# Patient Record
Sex: Female | Born: 1960 | Race: White | Hispanic: No | Marital: Married | Smoking: Former smoker
Health system: Southern US, Community
[De-identification: ages and names within clinical notes are randomized; demographics above are authoritative.]

## PROBLEM LIST (undated history)

## (undated) DIAGNOSIS — C801 Malignant (primary) neoplasm, unspecified: Secondary | ICD-10-CM

## (undated) DIAGNOSIS — R002 Palpitations: Secondary | ICD-10-CM

## (undated) DIAGNOSIS — M1990 Unspecified osteoarthritis, unspecified site: Secondary | ICD-10-CM

## (undated) DIAGNOSIS — I10 Essential (primary) hypertension: Secondary | ICD-10-CM

## (undated) DIAGNOSIS — D649 Anemia, unspecified: Secondary | ICD-10-CM

## (undated) DIAGNOSIS — E559 Vitamin D deficiency, unspecified: Secondary | ICD-10-CM

## (undated) DIAGNOSIS — E669 Obesity, unspecified: Secondary | ICD-10-CM

## (undated) DIAGNOSIS — E785 Hyperlipidemia, unspecified: Secondary | ICD-10-CM

## (undated) DIAGNOSIS — J302 Other seasonal allergic rhinitis: Secondary | ICD-10-CM

## (undated) HISTORY — DX: Vitamin D deficiency, unspecified: E55.9

## (undated) HISTORY — DX: Obesity, unspecified: E66.9

## (undated) HISTORY — PX: OTHER SURGICAL HISTORY: SHX169

## (undated) HISTORY — DX: Hyperlipidemia, unspecified: E78.5

## (undated) HISTORY — DX: Essential (primary) hypertension: I10

## (undated) HISTORY — PX: COLONOSCOPY: SHX174

## (undated) HISTORY — PX: CYST REMOVAL NECK: SHX6281

## (undated) HISTORY — DX: Anemia, unspecified: D64.9

---

## 1989-05-14 HISTORY — PX: TUBAL LIGATION: SHX77

## 2005-03-10 ENCOUNTER — Emergency Department: Payer: Self-pay | Admitting: Emergency Medicine

## 2006-08-17 ENCOUNTER — Other Ambulatory Visit: Payer: Self-pay

## 2006-08-17 ENCOUNTER — Emergency Department: Payer: Self-pay | Admitting: Emergency Medicine

## 2006-09-26 ENCOUNTER — Ambulatory Visit: Payer: Self-pay

## 2006-10-03 DIAGNOSIS — L720 Epidermal cyst: Secondary | ICD-10-CM | POA: Insufficient documentation

## 2006-10-08 ENCOUNTER — Ambulatory Visit: Payer: Self-pay

## 2006-10-09 DIAGNOSIS — D485 Neoplasm of uncertain behavior of skin: Secondary | ICD-10-CM | POA: Insufficient documentation

## 2008-06-15 DIAGNOSIS — F39 Unspecified mood [affective] disorder: Secondary | ICD-10-CM | POA: Insufficient documentation

## 2008-06-15 DIAGNOSIS — E7800 Pure hypercholesterolemia, unspecified: Secondary | ICD-10-CM | POA: Insufficient documentation

## 2008-06-23 ENCOUNTER — Ambulatory Visit: Payer: Self-pay | Admitting: Family Medicine

## 2008-06-23 LAB — HM MAMMOGRAPHY

## 2008-08-03 ENCOUNTER — Ambulatory Visit: Payer: Self-pay | Admitting: Gastroenterology

## 2008-08-10 ENCOUNTER — Ambulatory Visit: Payer: Self-pay | Admitting: Unknown Physician Specialty

## 2008-08-17 ENCOUNTER — Ambulatory Visit: Payer: Self-pay | Admitting: Unknown Physician Specialty

## 2008-10-05 DIAGNOSIS — D649 Anemia, unspecified: Secondary | ICD-10-CM | POA: Insufficient documentation

## 2010-04-21 ENCOUNTER — Ambulatory Visit: Payer: Self-pay

## 2012-05-14 DIAGNOSIS — C801 Malignant (primary) neoplasm, unspecified: Secondary | ICD-10-CM

## 2012-05-14 HISTORY — DX: Malignant (primary) neoplasm, unspecified: C80.1

## 2012-07-11 ENCOUNTER — Ambulatory Visit: Payer: Self-pay | Admitting: Family Medicine

## 2013-04-17 LAB — CBC AND DIFFERENTIAL
HCT: 37 % (ref 36–46)
Hemoglobin: 12.7 g/dL (ref 12.0–16.0)
Platelets: 244 10*3/uL (ref 150–399)
WBC: 6.1 10^3/mL

## 2013-04-17 LAB — TSH: TSH: 1.65 u[IU]/mL (ref 0.41–5.90)

## 2013-11-27 LAB — BASIC METABOLIC PANEL
BUN: 20 mg/dL (ref 4–21)
Creatinine: 0.8 mg/dL (ref 0.5–1.1)
Glucose: 86 mg/dL
Potassium: 4.2 mmol/L (ref 3.4–5.3)
Sodium: 145 mmol/L (ref 137–147)

## 2013-11-27 LAB — LIPID PANEL
Cholesterol: 204 mg/dL — AB (ref 0–200)
HDL: 57 mg/dL (ref 35–70)
LDL Cholesterol: 131 mg/dL
Triglycerides: 82 mg/dL (ref 40–160)

## 2013-11-27 LAB — HEPATIC FUNCTION PANEL
ALT: 20 U/L (ref 7–35)
AST: 24 U/L (ref 13–35)

## 2014-10-19 ENCOUNTER — Other Ambulatory Visit: Payer: Self-pay

## 2014-10-19 DIAGNOSIS — I10 Essential (primary) hypertension: Secondary | ICD-10-CM | POA: Insufficient documentation

## 2014-10-19 MED ORDER — HYDROCHLOROTHIAZIDE 25 MG PO TABS: 25.0000 mg | ORAL_TABLET | Freq: Every day | ORAL | Status: DC

## 2015-01-21 ENCOUNTER — Other Ambulatory Visit: Payer: Self-pay | Admitting: Family Medicine

## 2015-01-21 NOTE — Telephone Encounter (Signed)
This was a pt of Debbie.  Last ov was on 11/27/2013. Last time the Inderal was refilled was on 06/29/2014 Last time the HCTZ was refilled was on 02/24/2014 Last time the Zolof was refilled was on 06/29/2014  Thanks,

## 2015-03-03 ENCOUNTER — Other Ambulatory Visit: Payer: Self-pay | Admitting: Family Medicine

## 2015-03-03 DIAGNOSIS — I10 Essential (primary) hypertension: Secondary | ICD-10-CM

## 2015-03-03 DIAGNOSIS — Z800 Family history of malignant neoplasm of digestive organs: Secondary | ICD-10-CM | POA: Insufficient documentation

## 2015-03-03 DIAGNOSIS — F430 Acute stress reaction: Secondary | ICD-10-CM

## 2015-03-08 ENCOUNTER — Other Ambulatory Visit: Payer: Self-pay | Admitting: Family Medicine

## 2015-03-08 DIAGNOSIS — Z1231 Encounter for screening mammogram for malignant neoplasm of breast: Secondary | ICD-10-CM

## 2015-03-11 ENCOUNTER — Ambulatory Visit
Admission: RE | Admit: 2015-03-11 | Discharge: 2015-03-11 | Disposition: A | Discharge status: Home / Self Care | Payer: BLUE CROSS/BLUE SHIELD | Source: Ambulatory Visit | Attending: Family Medicine | Admitting: Family Medicine

## 2015-03-11 ENCOUNTER — Encounter: Payer: Self-pay | Admitting: Physician Assistant

## 2015-03-11 ENCOUNTER — Other Ambulatory Visit: Payer: Self-pay | Admitting: Physician Assistant

## 2015-03-11 ENCOUNTER — Ambulatory Visit (INDEPENDENT_AMBULATORY_CARE_PROVIDER_SITE_OTHER): Payer: BLUE CROSS/BLUE SHIELD | Admitting: Physician Assistant

## 2015-03-11 VITALS — BP 118/80 | HR 71 | Temp 98.1°F | Resp 16 | Ht 65.0 in | Wt 219.6 lb

## 2015-03-11 DIAGNOSIS — Z800 Family history of malignant neoplasm of digestive organs: Secondary | ICD-10-CM | POA: Diagnosis not present

## 2015-03-11 DIAGNOSIS — E669 Obesity, unspecified: Secondary | ICD-10-CM | POA: Insufficient documentation

## 2015-03-11 DIAGNOSIS — N959 Unspecified menopausal and perimenopausal disorder: Secondary | ICD-10-CM | POA: Insufficient documentation

## 2015-03-11 DIAGNOSIS — E7800 Pure hypercholesterolemia, unspecified: Secondary | ICD-10-CM

## 2015-03-11 DIAGNOSIS — Z1211 Encounter for screening for malignant neoplasm of colon: Secondary | ICD-10-CM

## 2015-03-11 DIAGNOSIS — Z124 Encounter for screening for malignant neoplasm of cervix: Secondary | ICD-10-CM | POA: Diagnosis not present

## 2015-03-11 DIAGNOSIS — M2560 Stiffness of unspecified joint, not elsewhere classified: Secondary | ICD-10-CM | POA: Insufficient documentation

## 2015-03-11 DIAGNOSIS — Z0000 Encounter for general adult medical examination without abnormal findings: Secondary | ICD-10-CM | POA: Diagnosis not present

## 2015-03-11 DIAGNOSIS — E559 Vitamin D deficiency, unspecified: Secondary | ICD-10-CM | POA: Insufficient documentation

## 2015-03-11 DIAGNOSIS — Z23 Encounter for immunization: Secondary | ICD-10-CM

## 2015-03-11 DIAGNOSIS — C4491 Basal cell carcinoma of skin, unspecified: Secondary | ICD-10-CM | POA: Insufficient documentation

## 2015-03-11 DIAGNOSIS — Z1231 Encounter for screening mammogram for malignant neoplasm of breast: Secondary | ICD-10-CM | POA: Insufficient documentation

## 2015-03-11 HISTORY — DX: Malignant (primary) neoplasm, unspecified: C80.1

## 2015-03-11 NOTE — Patient Instructions (Signed)
Health Maintenance, Female Adopting a healthy lifestyle and getting preventive care can go a long way to promote health and wellness. Talk with your health care provider about what schedule of regular examinations is right for you. This is a good chance for you to check in with your provider about disease prevention and staying healthy. In between checkups, there are plenty of things you can do on your own. Experts have done a lot of research about which lifestyle changes and preventive measures are most likely to keep you healthy. Ask your health care provider for more information. WEIGHT AND DIET  Eat a healthy diet  Be sure to include plenty of vegetables, fruits, low-fat dairy products, and lean protein.  Do not eat a lot of foods high in solid fats, added sugars, or salt.  Get regular exercise. This is one of the most important things you can do for your health.  Most adults should exercise for at least 150 minutes each week. The exercise should increase your heart rate and make you sweat (moderate-intensity exercise).  Most adults should also do strengthening exercises at least twice a week. This is in addition to the moderate-intensity exercise.  Maintain a healthy weight  Body mass index (BMI) is a measurement that can be used to identify possible weight problems. It estimates body fat based on height and weight. Your health care provider can help determine your BMI and help you achieve or maintain a healthy weight.  For females 20 years of age and older:   A BMI below 18.5 is considered underweight.  A BMI of 18.5 to 24.9 is normal.  A BMI of 25 to 29.9 is considered overweight.  A BMI of 30 and above is considered obese.  Watch levels of cholesterol and blood lipids  You should start having your blood tested for lipids and cholesterol at 53 years of age, then have this test every 5 years.  You may need to have your cholesterol levels checked more often if:  Your lipid  or cholesterol levels are high.  You are older than 53 years of age.  You are at high risk for heart disease.  CANCER SCREENING   Lung Cancer  Lung cancer screening is recommended for adults 55-80 years old who are at high risk for lung cancer because of a history of smoking.  A yearly low-dose CT scan of the lungs is recommended for people who:  Currently smoke.  Have quit within the past 15 years.  Have at least a 30-pack-year history of smoking. A pack year is smoking an average of one pack of cigarettes a day for 1 year.  Yearly screening should continue until it has been 15 years since you quit.  Yearly screening should stop if you develop a health problem that would prevent you from having lung cancer treatment.  Breast Cancer  Practice breast self-awareness. This means understanding how your breasts normally appear and feel.  It also means doing regular breast self-exams. Let your health care provider know about any changes, no matter how small.  If you are in your 20s or 30s, you should have a clinical breast exam (CBE) by a health care provider every 1-3 years as part of a regular health exam.  If you are 40 or older, have a CBE every year. Also consider having a breast X-ray (mammogram) every year.  If you have a family history of breast cancer, talk to your health care provider about genetic screening.  If you   are at high risk for breast cancer, talk to your health care provider about having an MRI and a mammogram every year.  Breast cancer gene (BRCA) assessment is recommended for women who have family members with BRCA-related cancers. BRCA-related cancers include:  Breast.  Ovarian.  Tubal.  Peritoneal cancers.  Results of the assessment will determine the need for genetic counseling and BRCA1 and BRCA2 testing. Cervical Cancer Your health care provider may recommend that you be screened regularly for cancer of the pelvic organs (ovaries, uterus, and  vagina). This screening involves a pelvic examination, including checking for microscopic changes to the surface of your cervix (Pap test). You may be encouraged to have this screening done every 3 years, beginning at age 21.  For women ages 30-65, health care providers may recommend pelvic exams and Pap testing every 3 years, or they may recommend the Pap and pelvic exam, combined with testing for human papilloma virus (HPV), every 5 years. Some types of HPV increase your risk of cervical cancer. Testing for HPV may also be done on women of any age with unclear Pap test results.  Other health care providers may not recommend any screening for nonpregnant women who are considered low risk for pelvic cancer and who do not have symptoms. Ask your health care provider if a screening pelvic exam is right for you.  If you have had past treatment for cervical cancer or a condition that could lead to cancer, you need Pap tests and screening for cancer for at least 20 years after your treatment. If Pap tests have been discontinued, your risk factors (such as having a new sexual partner) need to be reassessed to determine if screening should resume. Some women have medical problems that increase the chance of getting cervical cancer. In these cases, your health care provider may recommend more frequent screening and Pap tests. Colorectal Cancer  This type of cancer can be detected and often prevented.  Routine colorectal cancer screening usually begins at 53 years of age and continues through 53 years of age.  Your health care provider may recommend screening at an earlier age if you have risk factors for colon cancer.  Your health care provider may also recommend using home test kits to check for hidden blood in the stool.  A small camera at the end of a tube can be used to examine your colon directly (sigmoidoscopy or colonoscopy). This is done to check for the earliest forms of colorectal  cancer.  Routine screening usually begins at age 50.  Direct examination of the colon should be repeated every 5-10 years through 53 years of age. However, you may need to be screened more often if early forms of precancerous polyps or small growths are found. Skin Cancer  Check your skin from head to toe regularly.  Tell your health care provider about any new moles or changes in moles, especially if there is a change in a mole's shape or color.  Also tell your health care provider if you have a mole that is larger than the size of a pencil eraser.  Always use sunscreen. Apply sunscreen liberally and repeatedly throughout the day.  Protect yourself by wearing long sleeves, pants, a wide-brimmed hat, and sunglasses whenever you are outside. HEART DISEASE, DIABETES, AND HIGH BLOOD PRESSURE   High blood pressure causes heart disease and increases the risk of stroke. High blood pressure is more likely to develop in:  People who have blood pressure in the high end   of the normal range (130-139/85-89 mm Hg).  People who are overweight or obese.  People who are African American.  If you are 38-23 years of age, have your blood pressure checked every 3-5 years. If you are 61 years of age or older, have your blood pressure checked every year. You should have your blood pressure measured twice--once when you are at a hospital or clinic, and once when you are not at a hospital or clinic. Record the average of the two measurements. To check your blood pressure when you are not at a hospital or clinic, you can use:  An automated blood pressure machine at a pharmacy.  A home blood pressure monitor.  If you are between 45 years and 39 years old, ask your health care provider if you should take aspirin to prevent strokes.  Have regular diabetes screenings. This involves taking a blood sample to check your fasting blood sugar level.  If you are at a normal weight and have a low risk for diabetes,  have this test once every three years after 53 years of age.  If you are overweight and have a high risk for diabetes, consider being tested at a younger age or more often. PREVENTING INFECTION  Hepatitis B  If you have a higher risk for hepatitis B, you should be screened for this virus. You are considered at high risk for hepatitis B if:  You were born in a country where hepatitis B is common. Ask your health care provider which countries are considered high risk.  Your parents were born in a high-risk country, and you have not been immunized against hepatitis B (hepatitis B vaccine).  You have HIV or AIDS.  You use needles to inject street drugs.  You live with someone who has hepatitis B.  You have had sex with someone who has hepatitis B.  You get hemodialysis treatment.  You take certain medicines for conditions, including cancer, organ transplantation, and autoimmune conditions. Hepatitis C  Blood testing is recommended for:  Everyone born from [birth year] through 1964.  Anyone with known risk factors for hepatitis C. Sexually transmitted infections (STIs)  You should be screened for sexually transmitted infections (STIs) including gonorrhea and chlamydia if:  You are sexually active and are younger than 53 years of age.  You are older than 53 years of age and your health care provider tells you that you are at risk for this type of infection.  Your sexual activity has changed since you were last screened and you are at an increased risk for chlamydia or gonorrhea. Ask your health care provider if you are at risk.  If you do not have HIV, but are at risk, it may be recommended that you take a prescription medicine daily to prevent HIV infection. This is called pre-exposure prophylaxis (PrEP). You are considered at risk if:  You are sexually active and do not regularly use condoms or know the HIV status of your partner(s).  You take drugs by injection.  You are sexually  active with a partner who has HIV. Talk with your health care provider about whether you are at high risk of being infected with HIV. If you choose to begin PrEP, you should first be tested for HIV. You should then be tested every 3 months for as long as you are taking PrEP.  PREGNANCY   If you are premenopausal and you may become pregnant, ask your health care provider about preconception counseling.  If you may  become pregnant, take 400 to 800 micrograms (mcg) of folic acid every day.  If you want to prevent pregnancy, talk to your health care provider about birth control (contraception). OSTEOPOROSIS AND MENOPAUSE   Osteoporosis is a disease in which the bones lose minerals and strength with aging. This can result in serious bone fractures. Your risk for osteoporosis can be identified using a bone density scan.  If you are 61 years of age or older, or if you are at risk for osteoporosis and fractures, ask your health care provider if you should be screened.  Ask your health care provider whether you should take a calcium or vitamin D supplement to lower your risk for osteoporosis.  Menopause may have certain physical symptoms and risks.  Hormone replacement therapy may reduce some of these symptoms and risks. Talk to your health care provider about whether hormone replacement therapy is right for you.  HOME CARE INSTRUCTIONS   Schedule regular health, dental, and eye exams.  Stay current with your immunizations.   Do not use any tobacco products including cigarettes, chewing tobacco, or electronic cigarettes.  If you are pregnant, do not drink alcohol.  If you are breastfeeding, limit how much and how often you drink alcohol.  Limit alcohol intake to no more than 1 drink per day for nonpregnant women. One drink equals 12 ounces of beer, 5 ounces of wine, or 1 ounces of hard liquor.  Do not use street drugs.  Do not share needles.  Ask your health care provider for help if  you need support or information about quitting drugs.  Tell your health care provider if you often feel depressed.  Tell your health care provider if you have ever been abused or do not feel safe at home.   This information is not intended to replace advice given to you by your health care provider. Make sure you discuss any questions you have with your health care provider.   Document Released: 11/13/2010 Document Revised: 05/21/2014 Document Reviewed: 04/01/2013 Elsevier Interactive Patient Education [employer].

## 2015-03-11 NOTE — Progress Notes (Signed)
Patient: Nicole Copeland, Female    DOB: 07/08/1960, 54 y.o.   MRN: [id number] Visit Date: 03/11/2015  Today's Provider: Mar Daring, PA-C   Chief Complaint  Patient presents with  . Annual Exam   Subjective:    Annual physical exam Nicole Copeland is a 54 y.o. female who presents today for health maintenance and complete physical. She feels well. She reports exercising sometimes walks for 20-30 minutes. She reports she is sleeping well.Per patient just had a mammogram done today. She states it has been a while since her last Pap smear. She states her last Pap smear was normal however. There is no family history of cervical or ovarian cancer. She states she has had a colonoscopy previously but knows that she is due for one. Her mother did pass away from colon cancer.  She is also interested in trying to discontinue her Zoloft. She feels that the Zoloft is why she is put on some weight. She has been trying to lose weight without success. She was put on Zoloft for situational depression following the death of her mother and her sister 2 weeks apart. She does feel like she is doing better at this time.  Last PCP: 04/17/13 here.  Review of Systems  Constitutional: Negative.   HENT: Negative.   Eyes: Negative.   Respiratory: Negative.   Cardiovascular: Negative.   Gastrointestinal: Negative.   Endocrine: Negative.   Genitourinary: Negative.   Musculoskeletal: Positive for joint swelling and arthralgias.  Skin: Negative.   Allergic/Immunologic: Negative.   Neurological: Negative.   Hematological: Negative.   Psychiatric/Behavioral: Negative.     Social History She  reports that she has quit smoking. She does not have any smokeless tobacco history on file. She reports that she drinks alcohol. She reports that she does not use illicit drugs. Social History   Social History  . Marital Status: Married    Spouse Name: N/A  . Number of Children: N/A  . Years of  Education: N/A   Social History Main Topics  . Smoking status: Former Research scientist (life sciences)  . Smokeless tobacco: None     Comment: smoked for 20-25 years, previously smoked 1 PPD. Quit in 2007  . Alcohol Use: Yes     Comment: Occasional  . Drug Use: No  . Sexual Activity: Not Asked   Other Topics Concern  . None   Social History Narrative    Patient Active Problem List   Diagnosis Date Noted  . Avitaminosis D 03/11/2015  . Adiposity 03/11/2015  . Menopausal and postmenopausal disorder 03/11/2015  . Joint stiffness 03/11/2015  . Basal cell carcinoma 03/11/2015  . Family history of colon cancer 03/03/2015  . Acute stress disorder 03/03/2015  . Hypertension 10/19/2014  . 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 Pure hypercholesterolemia 06/15/2008  . Episodic mood disorder ([place]) 06/15/2008  . Neoplasm of uncertain behavior of skin 10/09/2006  . Atheroma, skin 10/03/2006    Past Surgical History  Procedure Laterality Date  . Tubal ligation  1991  . Status post endometrial ablation  2009 or 2010    not sure    Family History  Family Status  Relation Status Death Age  . Mother Deceased 23    Colon Cancer, chronic lung disease  . Father Deceased   . Maternal Grandmother Deceased     died from a stroke  . Maternal Grandfather Deceased     died from cancer. Unknown Type  . Paternal Grandmother Deceased  died in a house fire  . Paternal Grandfather Deceased     died from congestive heart failure   Her family history includes CAD in her father and mother; Congestive Heart Failure in her father and mother; Diabetes in her father and paternal grandfather; Hypertension in her maternal grandmother and mother.    Allergies  Allergen Reactions  . Penicillins     Previous Medications   ALPRAZOLAM (XANAX) 0.5 MG TABLET    Take by mouth.   ASCORBIC ACID (VITAMIN C) 500 MG TABLET    Take by mouth.   ASPIRIN 81 MG TABLET    Take by mouth.   CALCIUM CITRATE-VITAMIN D (CITRACAL+D) 315-200  MG-UNIT TABLET    Take by mouth.   HYDROCHLOROTHIAZIDE (HYDRODIURIL) 25 MG TABLET    TAKE 1 TABLET (25 MG TOTAL) BY MOUTH DAILY.   IBUPROFEN (MOTRIN IB) 200 MG TABLET    Take by mouth.   INDERAL XL 80 MG 24 HR CAPSULE    TAKE ONE CAPSULE BY MOUTH EVERY DAY   MULTIPLE VITAMINS-MINERALS (WOMENS 50+ MULTI VITAMIN/MIN) TABS    Take by mouth.   SERTRALINE (ZOLOFT) 50 MG TABLET    TAKE 1 (ONE) TABLET BY MOUTH DAILY    Patient Care Team: Mar Daring, PA-C as PCP - General (Family Medicine)     Objective:   Vitals: BP 118/80 mmHg  Pulse 71  Temp(Src) 98.1 F (36.7 C) (Oral)  Resp 16  Ht 5\' 5"  (1.651 m)  Wt 219 lb 9.6 oz (99.61 kg)  BMI 36.54 kg/m2   Physical Exam  Constitutional: She is oriented to person, place, and time. She appears well-developed and well-nourished. No distress.  HENT:  Head: Normocephalic and atraumatic.  Right Ear: Hearing, tympanic membrane, external ear and ear canal normal.  Left Ear: Hearing, tympanic membrane, external ear and ear canal normal.  Nose: Nose normal.  Mouth/Throat: Uvula is midline, oropharynx is clear and moist and mucous membranes are normal. No oropharyngeal exudate.  Eyes: Conjunctivae and EOM are normal. Pupils are equal, round, and reactive to light. Right eye exhibits no discharge. Left eye exhibits no discharge. No scleral icterus.  Neck: Normal range of motion. Neck supple. No JVD present. Carotid bruit is not present. No tracheal deviation present. No thyromegaly present.  Cardiovascular: Normal rate, regular rhythm, normal heart sounds and intact distal pulses.  Exam reveals no gallop and no friction rub.   No murmur heard. Pulmonary/Chest: Effort normal and breath sounds normal. No respiratory distress. She has no wheezes. She has no rales. She exhibits no tenderness. Right breast exhibits no inverted nipple, no mass, no nipple discharge, no skin change and no tenderness. Left breast exhibits no inverted nipple, no mass, no  nipple discharge, no skin change and no tenderness. Breasts are symmetrical.  Abdominal: Soft. Bowel sounds are normal. She exhibits no distension and no mass. There is no tenderness. There is no rebound and no guarding. Hernia confirmed negative in the right inguinal area and confirmed negative in the left inguinal area.  Genitourinary: Rectum normal, vagina normal and uterus normal. No breast swelling, tenderness, discharge or bleeding. Pelvic exam was performed with patient supine. There is no rash, tenderness, lesion or injury on the right labia. There is no rash, tenderness, lesion or injury on the left labia. Cervix exhibits discharge (white and clear mucous discharge). Cervix exhibits no motion tenderness and no friability. Right adnexum displays no mass, no tenderness and no fullness. Left adnexum displays no mass, no tenderness  and no fullness. No erythema, tenderness or bleeding in the vagina. No signs of injury around the vagina. No vaginal discharge found.  Musculoskeletal: Normal range of motion. She exhibits no edema or tenderness.  Lymphadenopathy:    She has no cervical adenopathy.       Right: No inguinal adenopathy present.       Left: No inguinal adenopathy present.  Neurological: She is alert and oriented to person, place, and time. She has normal reflexes. No cranial nerve deficit. Coordination normal.  Skin: Skin is warm and dry. No rash noted. She is not diaphoretic.  Psychiatric: She has a normal mood and affect. Her behavior is normal. Judgment and thought content normal.  Vitals reviewed.    Depression Screen No flowsheet data found.    Assessment & Plan:     Routine Health Maintenance and Physical Exam  1. Annual physical exam Physical exam today was normal. We did discuss discontinuation of her Zoloft. I will have her cut the 50 mg Zoloft in half and take 25 mg 1 week, then she is to take half a pill (25 mg) every other day 1 week, then she may discontinue if  she is doing well. She is to call the office if she is unable to discontinue or if she has any adverse reactions while discontinuing the medication. If she does well I will see her back in one year for her repeat physical exam. She may call the office for any questions or concerns she has in the meantime. - CBC with Differential - Hemoglobin A1c - Lipid panel - TSH - Comprehensive Metabolic Panel (CMET)  2. Need for influenza vaccination Flu vaccine was given today without complication. - Flu Vaccine QUAD 36+ mos IM  3. Pure hypercholesterolemia History of this. I will check her lipids and follow-up pending results. She is not currently on any medication for this. If labs are stable I will follow-up with her in one year for repeat physical exam. - Lipid panel  4. Family history of colon cancer Positive family history of colon cancer in her mother. This was cause of death.  5. Cervical cancer screening Pap collected today. We'll follow-up pending results. - Pap IG w/ reflex to HPV when [facility] ([facility])  6. Colon cancer screening Referral for colonoscopy. She states her last colonoscopy was done by Dr. Rock Nephew. - Ambulatory referral to Gastroenterology  Exercise Activities and Dietary recommendations Goals    None      Immunization History  Administered Date(s) Administered  . Tdap 03/23/2011    Health Maintenance  Topic Date Due  . Hepatitis C Screening  [birth year]  . HIV Screening  08/11/1976  . TETANUS/TDAP  08/11/1980  . PAP SMEAR  08/12/1982  . MAMMOGRAM  08/12/2011  . COLONOSCOPY  08/12/2011  . INFLUENZA VACCINE  12/13/2014      Discussed health benefits of physical activity, and encouraged her to engage in regular exercise appropriate for her age and condition.    --------------------------------------------------------------------

## 2015-03-12 LAB — CBC WITH DIFFERENTIAL/PLATELET
Basophils Absolute: 0 10*3/uL (ref 0.0–0.2)
Basos: 0 %
EOS (ABSOLUTE): 0.3 10*3/uL (ref 0.0–0.4)
Eos: 3 %
Hematocrit: 39.4 % (ref 34.0–46.6)
Hemoglobin: 13.1 g/dL (ref 11.1–15.9)
Immature Grans (Abs): 0 10*3/uL (ref 0.0–0.1)
Immature Granulocytes: 0 %
Lymphocytes Absolute: 2.7 10*3/uL (ref 0.7–3.1)
Lymphs: 33 %
MCH: 27.5 pg (ref 26.6–33.0)
MCHC: 33.2 g/dL (ref 31.5–35.7)
MCV: 83 fL (ref 79–97)
Monocytes Absolute: 0.7 10*3/uL (ref 0.1–0.9)
Monocytes: 9 %
Neutrophils Absolute: 4.3 10*3/uL (ref 1.4–7.0)
Neutrophils: 55 %
Platelets: 298 10*3/uL (ref 150–379)
RBC: 4.77 x10E6/uL (ref 3.77–5.28)
RDW: 14.5 % (ref 12.3–15.4)
WBC: 8 10*3/uL (ref 3.4–10.8)

## 2015-03-12 LAB — HEMOGLOBIN A1C
Est. average glucose Bld gHb Est-mCnc: 114 mg/dL
Hgb A1c MFr Bld: 5.6 % (ref 4.8–5.6)

## 2015-03-12 LAB — COMPREHENSIVE METABOLIC PANEL
ALT: 17 IU/L (ref 0–32)
AST: 22 IU/L (ref 0–40)
Albumin/Globulin Ratio: 1.8 (ref 1.1–2.5)
Albumin: 4.5 g/dL (ref 3.5–5.5)
Alkaline Phosphatase: 86 IU/L (ref 39–117)
BUN/Creatinine Ratio: 18 (ref 9–23)
BUN: 14 mg/dL (ref 6–24)
Bilirubin Total: 0.4 mg/dL (ref 0.0–1.2)
CO2: 26 mmol/L (ref 18–29)
Calcium: 9.7 mg/dL (ref 8.7–10.2)
Chloride: 99 mmol/L (ref 97–106)
Creatinine, Ser: 0.79 mg/dL (ref 0.57–1.00)
GFR calc Af Amer: 99 mL/min/{1.73_m2} (ref >59)
GFR calc non Af Amer: 86 mL/min/{1.73_m2} (ref >59)
Globulin, Total: 2.5 g/dL (ref 1.5–4.5)
Glucose: 72 mg/dL (ref 65–99)
Potassium: 4.2 mmol/L (ref 3.5–5.2)
Sodium: 140 mmol/L (ref 136–144)
Total Protein: 7 g/dL (ref 6.0–8.5)

## 2015-03-12 LAB — LIPID PANEL
Chol/HDL Ratio: 4 ratio units (ref 0.0–4.4)
Cholesterol, Total: 198 mg/dL (ref 100–199)
HDL: 49 mg/dL (ref >39)
LDL Calculated: 111 mg/dL — ABNORMAL HIGH (ref 0–99)
Triglycerides: 188 mg/dL — ABNORMAL HIGH (ref 0–149)
VLDL Cholesterol Cal: 38 mg/dL (ref 5–40)

## 2015-03-12 LAB — TSH: TSH: 2.4 u[IU]/mL (ref 0.450–4.500)

## 2015-03-16 LAB — PAP IG W/ RFLX HPV ASCU: PAP Smear Comment: 0

## 2015-03-17 ENCOUNTER — Telehealth: Payer: Self-pay | Admitting: Physician Assistant

## 2015-03-17 NOTE — Telephone Encounter (Signed)
Called patient I received the form and informed her that Tawanna Sat will be in office next week but I will make sure form gets fill out and to be fax. Just wanted patient to know I had the form.  Thanks,  -Tabithia Stroder

## 2015-03-17 NOTE — Telephone Encounter (Signed)
Pt stated she faxed a form that she needs Nicole Copeland to complete and fax back to her employer. Pt stated that she was told by someone in our office it was received. I advised pt that Nicole Copeland is out of the office this week. Pt request that it be done next week b/c it is due 03/29/15. Pt request a call when it is faxed. Thanks TNP

## 2015-03-18 ENCOUNTER — Telehealth: Payer: Self-pay

## 2015-03-18 NOTE — Telephone Encounter (Signed)
-----   Message from Mar Daring, [place] sent at 03/17/2015  3:22 PM EDT ----- Normal pap.

## 2015-03-18 NOTE — Telephone Encounter (Signed)
LMTCB  Thanks,  -Sydnee Lamour 

## 2015-03-18 NOTE — Telephone Encounter (Signed)
-----   Message from Mar Daring, [place] sent at 03/17/2015  3:12 PM EDT ----- All labs are stable and WNL with exception of triglycerides and LDL which were slightly elevated.  Limit cholesterol and high fat foods in diet. May start fish oil 1200 mg twice daily to help cholesterol control.  Will recheck in one year.

## 2015-03-18 NOTE — Telephone Encounter (Signed)
LM

## 2015-03-18 NOTE — Telephone Encounter (Signed)
Advised patient of lab results  

## 2015-03-21 NOTE — Telephone Encounter (Signed)
Left message letting her know form was faxed.  Thanks,  -Ashmi Blas

## 2015-04-10 ENCOUNTER — Other Ambulatory Visit: Payer: Self-pay | Admitting: Family Medicine

## 2015-04-10 DIAGNOSIS — I10 Essential (primary) hypertension: Secondary | ICD-10-CM

## 2015-04-10 DIAGNOSIS — F39 Unspecified mood [affective] disorder: Secondary | ICD-10-CM

## 2015-12-05 ENCOUNTER — Other Ambulatory Visit: Payer: Self-pay | Admitting: Physician Assistant

## 2015-12-05 DIAGNOSIS — I10 Essential (primary) hypertension: Secondary | ICD-10-CM

## 2015-12-05 DIAGNOSIS — F39 Unspecified mood [affective] disorder: Secondary | ICD-10-CM

## 2016-02-14 ENCOUNTER — Other Ambulatory Visit: Payer: Self-pay | Admitting: Physician Assistant

## 2016-02-14 DIAGNOSIS — Z1231 Encounter for screening mammogram for malignant neoplasm of breast: Secondary | ICD-10-CM

## 2016-02-27 ENCOUNTER — Telehealth: Payer: Self-pay | Admitting: Physician Assistant

## 2016-02-27 NOTE — Telephone Encounter (Signed)
Pt wants to know if we have her Blood type in her records.  Her call back is [phone]  Thanks Con Memos

## 2016-02-28 NOTE — Telephone Encounter (Signed)
Patient advised that we do not have her blood type in our records.

## 2016-03-16 ENCOUNTER — Ambulatory Visit: Payer: BLUE CROSS/BLUE SHIELD

## 2016-03-16 ENCOUNTER — Encounter: Payer: BLUE CROSS/BLUE SHIELD | Admitting: Physician Assistant

## 2016-03-20 ENCOUNTER — Encounter: Payer: Self-pay | Admitting: Physician Assistant

## 2016-03-20 ENCOUNTER — Ambulatory Visit (INDEPENDENT_AMBULATORY_CARE_PROVIDER_SITE_OTHER): Payer: BLUE CROSS/BLUE SHIELD | Admitting: Physician Assistant

## 2016-03-20 VITALS — BP 106/70 | HR 68 | Temp 98.0°F | Resp 16 | Ht 64.5 in | Wt 211.0 lb

## 2016-03-20 DIAGNOSIS — Z0000 Encounter for general adult medical examination without abnormal findings: Secondary | ICD-10-CM

## 2016-03-20 DIAGNOSIS — Z1211 Encounter for screening for malignant neoplasm of colon: Secondary | ICD-10-CM | POA: Diagnosis not present

## 2016-03-20 DIAGNOSIS — E559 Vitamin D deficiency, unspecified: Secondary | ICD-10-CM | POA: Diagnosis not present

## 2016-03-20 DIAGNOSIS — E7800 Pure hypercholesterolemia, unspecified: Secondary | ICD-10-CM | POA: Diagnosis not present

## 2016-03-20 DIAGNOSIS — Z833 Family history of diabetes mellitus: Secondary | ICD-10-CM | POA: Diagnosis not present

## 2016-03-20 DIAGNOSIS — Z800 Family history of malignant neoplasm of digestive organs: Secondary | ICD-10-CM | POA: Diagnosis not present

## 2016-03-20 DIAGNOSIS — I10 Essential (primary) hypertension: Secondary | ICD-10-CM | POA: Diagnosis not present

## 2016-03-20 NOTE — Patient Instructions (Signed)
Health Maintenance, Female Adopting a healthy lifestyle and getting preventive care can go a long way to promote health and wellness. Talk with your health care provider about what schedule of regular examinations is right for you. This is a good chance for you to check in with your provider about disease prevention and staying healthy. In between checkups, there are plenty of things you can do on your own. Experts have done a lot of research about which lifestyle changes and preventive measures are most likely to keep you healthy. Ask your health care provider for more information. WEIGHT AND DIET  Eat a healthy diet  Be sure to include plenty of vegetables, fruits, low-fat dairy products, and lean protein.  Do not eat a lot of foods high in solid fats, added sugars, or salt.  Get regular exercise. This is one of the most important things you can do for your health.  Most adults should exercise for at least 150 minutes each week. The exercise should increase your heart rate and make you sweat (moderate-intensity exercise).  Most adults should also do strengthening exercises at least twice a week. This is in addition to the moderate-intensity exercise.  Maintain a healthy weight  Body mass index (BMI) is a measurement that can be used to identify possible weight problems. It estimates body fat based on height and weight. Your health care provider can help determine your BMI and help you achieve or maintain a healthy weight.  For females 28 years of age and older:   A BMI below 18.5 is considered underweight.  A BMI of 18.5 to 24.9 is normal.  A BMI of 25 to 29.9 is considered overweight.  A BMI of 30 and above is considered obese.  Watch levels of cholesterol and blood lipids  You should start having your blood tested for lipids and cholesterol at 54 years of age, then have this test every 5 years.  You may need to have your cholesterol levels checked more often if:  Your lipid  or cholesterol levels are high.  You are older than 54 years of age.  You are at high risk for heart disease.  CANCER SCREENING   Lung Cancer  Lung cancer screening is recommended for adults 54-66 years old who are at high risk for lung cancer because of a history of smoking.  A yearly low-dose CT scan of the lungs is recommended for people who:  Currently smoke.  Have quit within the past 15 years.  Have at least a 30-pack-year history of smoking. A pack year is smoking an average of one pack of cigarettes a day for 1 year.  Yearly screening should continue until it has been 15 years since you quit.  Yearly screening should stop if you develop a health problem that would prevent you from having lung cancer treatment.  Breast Cancer  Practice breast self-awareness. This means understanding how your breasts normally appear and feel.  It also means doing regular breast self-exams. Let your health care provider know about any changes, no matter how small.  If you are in your 20s or 30s, you should have a clinical breast exam (CBE) by a health care provider every 1-3 years as part of a regular health exam.  If you are 25 or older, have a CBE every year. Also consider having a breast X-ray (mammogram) every year.  If you have a family history of breast cancer, talk to your health care provider about genetic screening.  If you  are at high risk for breast cancer, talk to your health care provider about having an MRI and a mammogram every year.  Breast cancer gene (BRCA) assessment is recommended for women who have family members with BRCA-related cancers. BRCA-related cancers include:  Breast.  Ovarian.  Tubal.  Peritoneal cancers.  Results of the assessment will determine the need for genetic counseling and BRCA1 and BRCA2 testing. Cervical Cancer Your health care provider may recommend that you be screened regularly for cancer of the pelvic organs (ovaries, uterus, and  vagina). This screening involves a pelvic examination, including checking for microscopic changes to the surface of your cervix (Pap test). You may be encouraged to have this screening done every 3 years, beginning at age 21.  For women ages 30-65, health care providers may recommend pelvic exams and Pap testing every 3 years, or they may recommend the Pap and pelvic exam, combined with testing for human papilloma virus (HPV), every 5 years. Some types of HPV increase your risk of cervical cancer. Testing for HPV may also be done on women of any age with unclear Pap test results.  Other health care providers may not recommend any screening for nonpregnant women who are considered low risk for pelvic cancer and who do not have symptoms. Ask your health care provider if a screening pelvic exam is right for you.  If you have had past treatment for cervical cancer or a condition that could lead to cancer, you need Pap tests and screening for cancer for at least 20 years after your treatment. If Pap tests have been discontinued, your risk factors (such as having a new sexual partner) need to be reassessed to determine if screening should resume. Some women have medical problems that increase the chance of getting cervical cancer. In these cases, your health care provider may recommend more frequent screening and Pap tests. Colorectal Cancer  This type of cancer can be detected and often prevented.  Routine colorectal cancer screening usually begins at 54 years of age and continues through 54 years of age.  Your health care provider may recommend screening at an earlier age if you have risk factors for colon cancer.  Your health care provider may also recommend using home test kits to check for hidden blood in the stool.  A small camera at the end of a tube can be used to examine your colon directly (sigmoidoscopy or colonoscopy). This is done to check for the earliest forms of colorectal  cancer.  Routine screening usually begins at age 50.  Direct examination of the colon should be repeated every 5-10 years through 54 years of age. However, you may need to be screened more often if early forms of precancerous polyps or small growths are found. Skin Cancer  Check your skin from head to toe regularly.  Tell your health care provider about any new moles or changes in moles, especially if there is a change in a mole's shape or color.  Also tell your health care provider if you have a mole that is larger than the size of a pencil eraser.  Always use sunscreen. Apply sunscreen liberally and repeatedly throughout the day.  Protect yourself by wearing long sleeves, pants, a wide-brimmed hat, and sunglasses whenever you are outside. HEART DISEASE, DIABETES, AND HIGH BLOOD PRESSURE   High blood pressure causes heart disease and increases the risk of stroke. High blood pressure is more likely to develop in:  People who have blood pressure in the high end   of the normal range (130-139/85-89 mm Hg).  People who are overweight or obese.  People who are African American.  If you are 38-23 years of age, have your blood pressure checked every 3-5 years. If you are 61 years of age or older, have your blood pressure checked every year. You should have your blood pressure measured twice--once when you are at a hospital or clinic, and once when you are not at a hospital or clinic. Record the average of the two measurements. To check your blood pressure when you are not at a hospital or clinic, you can use:  An automated blood pressure machine at a pharmacy.  A home blood pressure monitor.  If you are between 45 years and 39 years old, ask your health care provider if you should take aspirin to prevent strokes.  Have regular diabetes screenings. This involves taking a blood sample to check your fasting blood sugar level.  If you are at a normal weight and have a low risk for diabetes,  have this test once every three years after 54 years of age.  If you are overweight and have a high risk for diabetes, consider being tested at a younger age or more often. PREVENTING INFECTION  Hepatitis B  If you have a higher risk for hepatitis B, you should be screened for this virus. You are considered at high risk for hepatitis B if:  You were born in a country where hepatitis B is common. Ask your health care provider which countries are considered high risk.  Your parents were born in a high-risk country, and you have not been immunized against hepatitis B (hepatitis B vaccine).  You have HIV or AIDS.  You use needles to inject street drugs.  You live with someone who has hepatitis B.  You have had sex with someone who has hepatitis B.  You get hemodialysis treatment.  You take certain medicines for conditions, including cancer, organ transplantation, and autoimmune conditions. Hepatitis C  Blood testing is recommended for:  Everyone born from [birth year] through 1964.  Anyone with known risk factors for hepatitis C. Sexually transmitted infections (STIs)  You should be screened for sexually transmitted infections (STIs) including gonorrhea and chlamydia if:  You are sexually active and are younger than 54 years of age.  You are older than 53 years of age and your health care provider tells you that you are at risk for this type of infection.  Your sexual activity has changed since you were last screened and you are at an increased risk for chlamydia or gonorrhea. Ask your health care provider if you are at risk.  If you do not have HIV, but are at risk, it may be recommended that you take a prescription medicine daily to prevent HIV infection. This is called pre-exposure prophylaxis (PrEP). You are considered at risk if:  You are sexually active and do not regularly use condoms or know the HIV status of your partner(s).  You take drugs by injection.  You are sexually  active with a partner who has HIV. Talk with your health care provider about whether you are at high risk of being infected with HIV. If you choose to begin PrEP, you should first be tested for HIV. You should then be tested every 3 months for as long as you are taking PrEP.  PREGNANCY   If you are premenopausal and you may become pregnant, ask your health care provider about preconception counseling.  If you may  become pregnant, take 400 to 800 micrograms (mcg) of folic acid every day.  If you want to prevent pregnancy, talk to your health care provider about birth control (contraception). OSTEOPOROSIS AND MENOPAUSE   Osteoporosis is a disease in which the bones lose minerals and strength with aging. This can result in serious bone fractures. Your risk for osteoporosis can be identified using a bone density scan.  If you are 61 years of age or older, or if you are at risk for osteoporosis and fractures, ask your health care provider if you should be screened.  Ask your health care provider whether you should take a calcium or vitamin D supplement to lower your risk for osteoporosis.  Menopause may have certain physical symptoms and risks.  Hormone replacement therapy may reduce some of these symptoms and risks. Talk to your health care provider about whether hormone replacement therapy is right for you.  HOME CARE INSTRUCTIONS   Schedule regular health, dental, and eye exams.  Stay current with your immunizations.   Do not use any tobacco products including cigarettes, chewing tobacco, or electronic cigarettes.  If you are pregnant, do not drink alcohol.  If you are breastfeeding, limit how much and how often you drink alcohol.  Limit alcohol intake to no more than 1 drink per day for nonpregnant women. One drink equals 12 ounces of beer, 5 ounces of wine, or 1 ounces of hard liquor.  Do not use street drugs.  Do not share needles.  Ask your health care provider for help if  you need support or information about quitting drugs.  Tell your health care provider if you often feel depressed.  Tell your health care provider if you have ever been abused or do not feel safe at home.   This information is not intended to replace advice given to you by your health care provider. Make sure you discuss any questions you have with your health care provider.   Document Released: 11/13/2010 Document Revised: 05/21/2014 Document Reviewed: 04/01/2013 Elsevier Interactive Patient Education [employer].

## 2016-03-20 NOTE — Progress Notes (Signed)
Patient: Nicole Copeland, Female    DOB: 11-Sep-1960, 55 y.o.   MRN: [id number] Visit Date: 03/20/2016  Today's Provider: Mar Daring, PA-C   Chief Complaint  Patient presents with  . Annual Exam   Subjective:    Annual physical exam Nicole Copeland is a 55 y.o. female who presents today for health maintenance and complete physical. She feels well. She reports exercising active with daily activites. She reports she is sleeping well.  03/11/15 CPE 03/11/15 Pap-neg 03/11/15 Mammogram-BI-RADS 1; Negative FH 08/03/08 Colonoscopy-polyps; Was to repeat in 3 years; +FH in mother (cause of death at 38 yo) Flu vaccine given at work; Patient cannot remember exact date -----------------------------------------------------------------   Review of Systems  Constitutional: Negative.   HENT: Negative.   Eyes: Negative.   Respiratory: Negative.   Cardiovascular: Negative.   Gastrointestinal: Negative.   Endocrine: Negative.   Genitourinary: Negative.   Musculoskeletal: Positive for back pain and joint swelling.  Skin: Negative.   Allergic/Immunologic: Negative.   Neurological: Negative.   Hematological: Negative.   Psychiatric/Behavioral: Negative.     Social History      She  reports that she has been smoking Cigarettes.  She has never used smokeless tobacco. She reports that she drinks alcohol. She reports that she does not use drugs.       Social History   Social History  . Marital status: Married    Spouse name: N/A  . Number of children: 4  . Years of education: N/A   Social History Main Topics  . Smoking status: Current Some Day Smoker    Types: Cigarettes  . Smokeless tobacco: Never Used     Comment: smoked for 20-25 years, previously smoked 1 PPD. Quit in 2007  . Alcohol use Yes     Comment: Occasional  . Drug use: No  . Sexual activity: Not Asked   Other Topics Concern  . None   Social History Narrative  . None    Past Medical History:    Diagnosis Date  . Anemia   . Cancer ([place]) 2014   basal cell skin cancer  . Hyperlipidemia   . Hypertension   . Obesity   . Vitamin D deficiency      Patient Active Problem List   Diagnosis Date Noted  . Avitaminosis D 03/11/2015  . Adiposity 03/11/2015  . Menopausal and postmenopausal disorder 03/11/2015  . Joint stiffness 03/11/2015  . Basal cell carcinoma 03/11/2015  . Family history of colon cancer 03/03/2015  . Acute stress disorder 03/03/2015  . Hypertension 10/19/2014  . 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 Pure hypercholesterolemia 06/15/2008  . Episodic mood disorder ([place]) 06/15/2008  . Neoplasm of uncertain behavior of skin 10/09/2006  . Atheroma, skin 10/03/2006    Past Surgical History:  Procedure Laterality Date  . Status post endometrial ablation  2009 or 2010   not sure  . TUBAL LIGATION  1991    Family History        Family Status  Relation Status  . Mother Deceased at age 14   Colon Cancer, chronic lung disease  . Father Deceased  . Maternal Grandmother Deceased   died from a stroke  . Maternal Grandfather Deceased   died from cancer. Unknown Type  . Paternal Grandmother Deceased   died in a house fire  . Paternal Grandfather Deceased   died from congestive heart failure  . Sister Deceased at age 40   kidney   .  Daughter Alive  . Son Alive  . Sister Alive  . Sister Alive  . Daughter Alive  . Daughter Alive        Her family history includes CAD in her father and mother; Cancer in her mother; Celiac disease in her sister; Congestive Heart Failure in her father and mother; Diabetes in her father, paternal grandfather, and sister; Hypertension in her maternal grandmother and mother; Kidney disease in her sister; Osteoarthritis in her sister.     Allergies  Allergen Reactions  . Penicillins      Current Outpatient Prescriptions:  .  ascorbic acid (VITAMIN C) 500 MG tablet, Take by mouth., Disp: , Rfl:  .  aspirin 81 MG tablet, Take by  mouth., Disp: , Rfl:  .  calcium citrate-vitamin D (CITRACAL+D) 315-200 MG-UNIT tablet, Take by mouth., Disp: , Rfl:  .  hydrochlorothiazide (HYDRODIURIL) 25 MG tablet, TAKE 1 TABLET (25 MG TOTAL) BY MOUTH DAILY., Disp: 30 tablet, Rfl: 5 .  ibuprofen (MOTRIN IB) 200 MG tablet, Take by mouth., Disp: , Rfl:  .  INDERAL XL 80 MG 24 hr capsule, TAKE ONE CAPSULE BY MOUTH EVERY DAY, Disp: 30 capsule, Rfl: 5 .  Multiple Vitamins-Minerals (WOMENS 50+ MULTI VITAMIN/MIN) TABS, Take by mouth., Disp: , Rfl:  .  sertraline (ZOLOFT) 50 MG tablet, TAKE 1 TABLET EVERY DAY, Disp: 30 tablet, Rfl: 5   Patient Care Team: Mar Daring, PA-C as PCP - General (Family Medicine)      Objective:   Vitals: BP 106/70 (BP Location: Right Arm, Patient Position: Sitting, Cuff Size: Large)   Pulse 68   Temp 98 F (36.7 C) (Oral)   Resp 16   Ht 5' 4.5" (1.638 m)   Wt 211 lb (95.7 kg)   BMI 35.66 kg/m    Physical Exam  Constitutional: She is oriented to person, place, and time. She appears well-developed and well-nourished. No distress.  HENT:  Head: Normocephalic and atraumatic.  Right Ear: Tympanic membrane, external ear and ear canal normal.  Left Ear: Tympanic membrane, external ear and ear canal normal.  Nose: Nose normal.  Mouth/Throat: Uvula is midline, oropharynx is clear and moist and mucous membranes are normal. No oropharyngeal exudate.  Eyes: Conjunctivae and EOM are normal. Pupils are equal, round, and reactive to light. Right eye exhibits no discharge. Left eye exhibits no discharge. No scleral icterus.  Neck: Normal range of motion. Neck supple. No JVD present. Carotid bruit is not present. No tracheal deviation present. No thyromegaly present.  Cardiovascular: Normal rate, regular rhythm, normal heart sounds and intact distal pulses.  Exam reveals no gallop and no friction rub.   No murmur heard. Pulmonary/Chest: Effort normal and breath sounds normal. No respiratory distress. She has no  wheezes. She has no rales. She exhibits no tenderness. Right breast exhibits no inverted nipple, no mass, no nipple discharge, no skin change and no tenderness. Left breast exhibits no inverted nipple, no mass, no nipple discharge, no skin change and no tenderness. Breasts are symmetrical.  Abdominal: Soft. Bowel sounds are normal. She exhibits no distension and no mass. There is no tenderness. There is no rebound and no guarding.  Musculoskeletal: Normal range of motion. She exhibits no edema or tenderness.  Lymphadenopathy:    She has no cervical adenopathy.  Neurological: She is alert and oriented to person, place, and time.  Skin: Skin is warm and dry. No rash noted. She is not diaphoretic.  Psychiatric: She has a normal mood and affect.  Her behavior is normal. Judgment and thought content normal.  Vitals reviewed.   Depression Screen PHQ 2/9 Scores 03/20/2016  PHQ - 2 Score 0      Assessment & Plan:     Routine Health Maintenance and Physical Exam  Exercise Activities and Dietary recommendations Goals    None      Immunization History  Administered Date(s) Administered  . Influenza,inj,Quad PF,36+ Mos 03/11/2015  . Tdap 03/23/2011    Health Maintenance  Topic Date Due  . COLONOSCOPY  08/12/2011  . INFLUENZA VACCINE  12/13/2015  . MAMMOGRAM  03/10/2017  . PAP SMEAR  03/09/2018  . TETANUS/TDAP  03/22/2021  . Hepatitis C Screening  Completed  . HIV Screening  Completed     Discussed health benefits of physical activity, and encouraged her to engage in regular exercise appropriate for her age and condition.    1. Annual physical exam Normal physical exam today. Will check labs as below and f/u pending lab results. If labs are stable and WNL she will not need to have these rechecked for one year at her next annual physical exam. She is to call the office in the meantime if she has any acute issue, questions or concerns. - TSH  2. Essential hypertension Stable.  Continue current medical treatment plan. Will check labs as below and f/u pending results. - CBC with Differential - Comprehensive metabolic panel  3. Avitaminosis D Stable.   4. Pure hypercholesterolemia Will check labs as below and f/u pending results. - Comprehensive metabolic panel - Lipid panel  5. Colon cancer screening Family history in mother; diagnosed and cause of death at 33. Previously done at [place]. Will refer back to [facility] for colonoscopy. - Ambulatory referral to Gastroenterology  6. Family history of colon cancer See above medical treatment plan. - Ambulatory referral to Gastroenterology  7. Family history of diabetes mellitus Will check labs as below and f/u pending results. - Hemoglobin A1c  --------------------------------------------------------------------    Mar Daring, PA-C  [facility]

## 2016-03-21 ENCOUNTER — Telehealth: Payer: Self-pay

## 2016-03-21 LAB — CBC WITH DIFFERENTIAL/PLATELET
Basophils Absolute: 0 10*3/uL (ref 0.0–0.2)
Basos: 0 %
EOS (ABSOLUTE): 0.3 10*3/uL (ref 0.0–0.4)
Eos: 5 %
Hematocrit: 38.2 % (ref 34.0–46.6)
Hemoglobin: 12.8 g/dL (ref 11.1–15.9)
Immature Grans (Abs): 0 10*3/uL (ref 0.0–0.1)
Immature Granulocytes: 0 %
Lymphocytes Absolute: 2 10*3/uL (ref 0.7–3.1)
Lymphs: 34 %
MCH: 27.9 pg (ref 26.6–33.0)
MCHC: 33.5 g/dL (ref 31.5–35.7)
MCV: 83 fL (ref 79–97)
Monocytes Absolute: 0.4 10*3/uL (ref 0.1–0.9)
Monocytes: 7 %
Neutrophils Absolute: 3.2 10*3/uL (ref 1.4–7.0)
Neutrophils: 54 %
Platelets: 280 10*3/uL (ref 150–379)
RBC: 4.59 x10E6/uL (ref 3.77–5.28)
RDW: 14.7 % (ref 12.3–15.4)
WBC: 5.9 10*3/uL (ref 3.4–10.8)

## 2016-03-21 LAB — LIPID PANEL
Chol/HDL Ratio: 4.7 ratio units — ABNORMAL HIGH (ref 0.0–4.4)
Cholesterol, Total: 221 mg/dL — ABNORMAL HIGH (ref 100–199)
HDL: 47 mg/dL (ref >39)
LDL Calculated: 135 mg/dL — ABNORMAL HIGH (ref 0–99)
Triglycerides: 195 mg/dL — ABNORMAL HIGH (ref 0–149)
VLDL Cholesterol Cal: 39 mg/dL (ref 5–40)

## 2016-03-21 LAB — COMPREHENSIVE METABOLIC PANEL
ALT: 18 IU/L (ref 0–32)
AST: 21 IU/L (ref 0–40)
Albumin/Globulin Ratio: 1.5 (ref 1.2–2.2)
Albumin: 4.3 g/dL (ref 3.5–5.5)
Alkaline Phosphatase: 76 IU/L (ref 39–117)
BUN/Creatinine Ratio: 18 (ref 9–23)
BUN: 13 mg/dL (ref 6–24)
Bilirubin Total: 0.4 mg/dL (ref 0.0–1.2)
CO2: 24 mmol/L (ref 18–29)
Calcium: 9.2 mg/dL (ref 8.7–10.2)
Chloride: 100 mmol/L (ref 96–106)
Creatinine, Ser: 0.73 mg/dL (ref 0.57–1.00)
GFR calc Af Amer: 108 mL/min/{1.73_m2} (ref >59)
GFR calc non Af Amer: 94 mL/min/{1.73_m2} (ref >59)
Globulin, Total: 2.8 g/dL (ref 1.5–4.5)
Glucose: 74 mg/dL (ref 65–99)
Potassium: 4.4 mmol/L (ref 3.5–5.2)
Sodium: 141 mmol/L (ref 134–144)
Total Protein: 7.1 g/dL (ref 6.0–8.5)

## 2016-03-21 LAB — HEMOGLOBIN A1C
Est. average glucose Bld gHb Est-mCnc: 103 mg/dL
Hgb A1c MFr Bld: 5.2 % (ref 4.8–5.6)

## 2016-03-21 LAB — TSH: TSH: 2.28 u[IU]/mL (ref 0.450–4.500)

## 2016-03-21 NOTE — Telephone Encounter (Signed)
-----   Message from Mar Daring, [place] sent at 03/21/2016  8:08 AM EST ----- All labs are stable with exception of cholesterol that is up just slightly from last year. Make sure to avoid fatty foods and foods high in cholesterol. Try to increase physical activity to equal 150 min moderate activity per week.

## 2016-03-21 NOTE — Telephone Encounter (Signed)
Patient advised as below. Patient verbalizes understanding and is in agreement with treatment plan.  

## 2016-03-23 ENCOUNTER — Ambulatory Visit: Payer: BLUE CROSS/BLUE SHIELD

## 2016-03-23 ENCOUNTER — Telehealth: Payer: Self-pay

## 2016-03-23 ENCOUNTER — Other Ambulatory Visit: Payer: Self-pay

## 2016-03-23 NOTE — Telephone Encounter (Signed)
Gastroenterology Pre-Procedure Review  Request Date: 04/20/2016 Requesting Physician: Dr. Marlyn Corporal  PATIENT REVIEW QUESTIONS: The patient responded to the following health history questions as indicated:    1. Are you having any GI issues? no 2. Do you have a personal history of Polyps? yes (benign) 3. Do you have a family history of Colon Cancer or Polyps? yes (mother colon cancer) 4. Diabetes Mellitus? no 5. Joint replacements in the past 12 months?no 6. Major health problems in the past 3 months?no 7. Any artificial heart valves, MVP, or defibrillator?no    MEDICATIONS & ALLERGIES:    Patient reports the following regarding taking any anticoagulation/antiplatelet therapy:   Plavix, Coumadin, Eliquis, Xarelto, Lovenox, Pradaxa, Brilinta, or Effient? no Aspirin? yes (heart health)  Patient confirms/reports the following medications:  Current Outpatient Prescriptions  Medication Sig Dispense Refill  . ascorbic acid (VITAMIN C) 500 MG tablet Take by mouth.    Marland Kitchen aspirin 81 MG tablet Take by mouth.    . calcium citrate-vitamin D (CITRACAL+D) 315-200 MG-UNIT tablet Take by mouth.    . hydrochlorothiazide (HYDRODIURIL) 25 MG tablet TAKE 1 TABLET (25 MG TOTAL) BY MOUTH DAILY. 30 tablet 5  . ibuprofen (MOTRIN IB) 200 MG tablet Take by mouth.    . INDERAL XL 80 MG 24 hr capsule TAKE ONE CAPSULE BY MOUTH EVERY DAY 30 capsule 5  . Multiple Vitamins-Minerals (WOMENS 50+ MULTI VITAMIN/MIN) TABS Take by mouth.    . sertraline (ZOLOFT) 50 MG tablet TAKE 1 TABLET EVERY DAY 30 tablet 5   No current facility-administered medications for this visit.     Patient confirms/reports the following allergies:  Allergies  Allergen Reactions  . Penicillins     No orders of the defined types were placed in this encounter.   AUTHORIZATION INFORMATION Primary Insurance: 1D#: Group #:  Secondary Insurance: 1D#: Group #:  SCHEDULE INFORMATION: Date: 04/20/2016 Time: Location: [facility]

## 2016-03-23 NOTE — Telephone Encounter (Signed)
Screening Colonoscopy Z12.11 Family Hx of colon cancer Z80.0 [facility] 04/20/2016 Dr. Lannie Fields Pre cert is not required

## 2016-04-02 ENCOUNTER — Encounter: Payer: Self-pay | Admitting: Physician Assistant

## 2016-04-02 ENCOUNTER — Ambulatory Visit
Admission: RE | Admit: 2016-04-02 | Discharge: 2016-04-02 | Disposition: A | Discharge status: Home / Self Care | Payer: BLUE CROSS/BLUE SHIELD | Source: Ambulatory Visit | Attending: Physician Assistant | Admitting: Physician Assistant

## 2016-04-02 ENCOUNTER — Ambulatory Visit (INDEPENDENT_AMBULATORY_CARE_PROVIDER_SITE_OTHER): Payer: BLUE CROSS/BLUE SHIELD | Admitting: Physician Assistant

## 2016-04-02 ENCOUNTER — Telehealth: Payer: Self-pay

## 2016-04-02 VITALS — BP 112/70 | HR 75 | Temp 98.1°F | Resp 16 | Wt 210.8 lb

## 2016-04-02 DIAGNOSIS — J189 Pneumonia, unspecified organism: Secondary | ICD-10-CM

## 2016-04-02 DIAGNOSIS — R0789 Other chest pain: Secondary | ICD-10-CM | POA: Diagnosis not present

## 2016-04-02 DIAGNOSIS — R059 Cough, unspecified: Secondary | ICD-10-CM

## 2016-04-02 DIAGNOSIS — R05 Cough: Secondary | ICD-10-CM

## 2016-04-02 DIAGNOSIS — R079 Chest pain, unspecified: Secondary | ICD-10-CM | POA: Insufficient documentation

## 2016-04-02 DIAGNOSIS — J181 Lobar pneumonia, unspecified organism: Secondary | ICD-10-CM | POA: Diagnosis not present

## 2016-04-02 MED ORDER — LEVOFLOXACIN 750 MG PO TABS: 750.0000 mg | ORAL_TABLET | Freq: Every day | ORAL | 0 refills | Status: DC

## 2016-04-02 MED ORDER — HYDROCODONE-HOMATROPINE 5-1.5 MG/5ML PO SYRP: 5.0000 mL | ORAL_SOLUTION | Freq: Three times a day (TID) | ORAL | 0 refills | Status: DC | PRN

## 2016-04-02 NOTE — Patient Instructions (Signed)
Community-Acquired Pneumonia, Adult °Pneumonia is an infection of the lungs. There are different types of pneumonia. One type can develop while a person is in a hospital. A different type, called community-acquired pneumonia, develops in people who are not, or have not recently been, in the hospital or other health care facility. °What are the causes? °Pneumonia may be caused by bacteria, viruses, or funguses. Community-acquired pneumonia is often caused by Streptococcus pneumonia bacteria. These bacteria are often passed from one person to another by breathing in droplets from the cough or sneeze of an infected person. °What increases the risk? °The condition is more likely to develop in: °· People who have chronic diseases, such as chronic obstructive pulmonary disease (COPD), asthma, congestive heart failure, cystic fibrosis, diabetes, or kidney disease. °· People who have early-stage or late-stage HIV. °· People who have sickle cell disease. °· People who have had their spleen removed (splenectomy). °· People who have poor dental hygiene. °· People who have medical conditions that increase the risk of breathing in (aspirating) secretions their own mouth and nose. °· People who have a weakened immune system (immunocompromised). °· People who smoke. °· People who travel to areas where pneumonia-causing germs commonly exist. °· People who are around animal habitats or animals that have pneumonia-causing germs, including birds, bats, rabbits, cats, and farm animals. ° °What are the signs or symptoms? °Symptoms of this condition include: °· A dry cough. °· A wet (productive) cough. °· Fever. °· Sweating. °· Chest pain, especially when breathing deeply or coughing. °· Rapid breathing or difficulty breathing. °· Shortness of breath. °· Shaking chills. °· Fatigue. °· Muscle aches. ° °How is this diagnosed? °Your health care provider will take a medical history and perform a physical exam. You may also have other tests,  including: °· Imaging studies of your chest, including X-rays. °· Tests to check your blood oxygen level and other blood gases. °· Other tests on blood, mucus (sputum), fluid around your lungs (pleural fluid), and urine. ° °If your pneumonia is severe, other tests may be done to identify the specific cause of your illness. °How is this treated? °The type of treatment that you receive depends on many factors, such as the cause of your pneumonia, the medicines you take, and other medical conditions that you have. For most adults, treatment and recovery from pneumonia may occur at home. In some cases, treatment must happen in a hospital. Treatment may include: °· Antibiotic medicines, if the pneumonia was caused by bacteria. °· Antiviral medicines, if the pneumonia was caused by a virus. °· Medicines that are given by mouth or through an IV tube. °· Oxygen. °· Respiratory therapy. ° °Although rare, treating severe pneumonia may include: °· Mechanical ventilation. This is done if you are not breathing well on your own and you cannot maintain a safe blood oxygen level. °· Thoracentesis. This procedure removes fluid around one lung or both lungs to help you breathe better. ° °Follow these instructions at home: °· Take over-the-counter and prescription medicines only as told by your health care provider. °? Only take cough medicine if you are losing sleep. Understand that cough medicine can prevent your body’s natural ability to remove mucus from your lungs. °? If you were prescribed an antibiotic medicine, take it as told by your health care provider. Do not stop taking the antibiotic even if you start to feel better. °· Sleep in a semi-upright position at night. Try sleeping in a reclining chair, or place a few pillows under your head. °· Do not use tobacco products, including cigarettes, chewing   tobacco, and e-cigarettes. If you need help quitting, ask your health care provider. °· Drink enough water to keep your urine  clear or pale yellow. This will help to thin out mucus secretions in your lungs. °How is this prevented? °There are ways that you can decrease your risk of developing community-acquired pneumonia. Consider getting a pneumococcal vaccine if: °· You are older than 54 years of age. °· You are older than 54 years of age and are undergoing cancer treatment, have chronic lung disease, or have other medical conditions that affect your immune system. Ask your health care provider if this applies to you. ° °There are different types and schedules of pneumococcal vaccines. Ask your health care provider which vaccination option is best for you. °You may also prevent community-acquired pneumonia if you take these actions: °· Get an influenza vaccine every year. Ask your health care provider which type of influenza vaccine is best for you. °· Go to the dentist on a regular basis. °· Wash your hands often. Use hand sanitizer if soap and water are not available. ° °Contact a health care provider if: °· You have a fever. °· You are losing sleep because you cannot control your cough with cough medicine. °Get help right away if: °· You have worsening shortness of breath. °· You have increased chest pain. °· Your sickness becomes worse, especially if you are an older adult or have a weakened immune system. °· You cough up blood. °This information is not intended to replace advice given to you by your health care provider. Make sure you discuss any questions you have with your health care provider. °Document Released: 04/30/2005 Document Revised: 09/08/2015 Document Reviewed: 08/25/2014 °Elsevier Interactive Patient Education © 2017 [employer] ° °

## 2016-04-02 NOTE — Progress Notes (Addendum)
Patient: Nicole Copeland Female    DOB: [birth year]   55 y.o.   MRN: [id number] Visit Date: 04/02/2016  Today's Provider: Mar Daring, PA-C   Chief Complaint  Patient presents with  . URI   Subjective:    HPI Upper Respiratory Infection: Patient complains of symptoms of a URI. Symptoms include congestion, cough, plugged sensation in right ear she reports it feels better than it did Thursday. and sore throat. Onset of symptoms was a few days ago, last Thursday, gradually worsening since that time. Patient called the telecom through her insurance and spoke with a nurse and she was prescribed Prednisone, which she took last dose today, and Benzonatate. She reports still having prolonged cough that is worse at night, and chest tightness. She is also having left  posterior chest wall pain.    Allergies  Allergen Reactions  . Penicillins      Current Outpatient Prescriptions:  .  ascorbic acid (VITAMIN C) 500 MG tablet, Take by mouth., Disp: , Rfl:  .  aspirin 81 MG tablet, Take by mouth., Disp: , Rfl:  .  calcium citrate-vitamin D (CITRACAL+D) 315-200 MG-UNIT tablet, Take by mouth., Disp: , Rfl:  .  hydrochlorothiazide (HYDRODIURIL) 25 MG tablet, TAKE 1 TABLET (25 MG TOTAL) BY MOUTH DAILY., Disp: 30 tablet, Rfl: 5 .  ibuprofen (MOTRIN IB) 200 MG tablet, Take by mouth., Disp: , Rfl:  .  INDERAL XL 80 MG 24 hr capsule, TAKE ONE CAPSULE BY MOUTH EVERY DAY, Disp: 30 capsule, Rfl: 5 .  Multiple Vitamins-Minerals (WOMENS 50+ MULTI VITAMIN/MIN) TABS, Take by mouth., Disp: , Rfl:  .  sertraline (ZOLOFT) 50 MG tablet, TAKE 1 TABLET EVERY DAY, Disp: 30 tablet, Rfl: 5  Review of Systems  Constitutional: Positive for chills and fatigue. Negative for fever.  HENT: Positive for congestion, ear pain and sore throat. Negative for hearing loss, postnasal drip, rhinorrhea, sinus pain, sinus pressure, sneezing, tinnitus and trouble swallowing.   Eyes: Negative for visual disturbance.    Respiratory: Positive for cough and chest tightness. Negative for shortness of breath and wheezing.   Cardiovascular: Positive for chest pain (left posterior chest wall). Negative for palpitations and leg swelling.  Gastrointestinal: Negative for abdominal pain and nausea.  Neurological: Positive for headaches. Negative for dizziness.    Social History  Substance Use Topics  . Smoking status: Current Some Day Smoker    Types: Cigarettes  . Smokeless tobacco: Never Used     Comment: smoked for 20-25 years, previously smoked 1 PPD. Quit in 2007  . Alcohol use Yes     Comment: Occasional   Objective:   BP 112/70 (BP Location: Right Arm, Patient Position: Sitting, Cuff Size: Normal)   Pulse 75   Temp 98.1 F (36.7 C) (Oral)   Resp 16   Wt 210 lb 12.8 oz (95.6 kg)   SpO2 97%   BMI 35.62 kg/m   Physical Exam  Constitutional: She appears well-developed and well-nourished. No distress.  HENT:  Head: Normocephalic and atraumatic.  Right Ear: Hearing, external ear and ear canal normal. Tympanic membrane is not erythematous and not bulging. A middle ear effusion is present.  Left Ear: Hearing, tympanic membrane, external ear and ear canal normal.  Nose: No mucosal edema or rhinorrhea. Right sinus exhibits no maxillary sinus tenderness and no frontal sinus tenderness. Left sinus exhibits no maxillary sinus tenderness and no frontal sinus tenderness.  Mouth/Throat: Uvula is midline and mucous membranes are normal.  Posterior oropharyngeal erythema present. No oropharyngeal exudate or posterior oropharyngeal edema.  Eyes: Conjunctivae are normal. Pupils are equal, round, and reactive to light. Right eye exhibits no discharge. Left eye exhibits no discharge. No scleral icterus.  Neck: Normal range of motion. Neck supple. No tracheal deviation present. No thyromegaly present.  Cardiovascular: Normal rate, regular rhythm and normal heart sounds.  Exam reveals no gallop and no friction rub.   No  murmur heard. Pulmonary/Chest: Effort normal. No stridor. No respiratory distress. She has decreased breath sounds in the right lower field. She has no wheezes. She has no rhonchi. She has no rales. She exhibits tenderness (left posterior chest wall).  Lymphadenopathy:    She has no cervical adenopathy.  Skin: Skin is warm and dry. She is not diaphoretic.  Vitals reviewed.      Assessment & Plan:     1. Pneumonia of left lower lobe due to infectious organism [facility]) I do suspect that patient may have walking pneumonia due to leftt lower chest wall pain with decreased breath sounds in that area. There were no adventitious breath sounds heard on today's exam. I will give Levaquin as below. I've also ordered a chest x-ray and will follow up pending these results. - levofloxacin (LEVAQUIN) 750 MG tablet; Take 1 tablet (750 mg total) by mouth daily.  Dispense: 7 tablet; Refill: 0  2. Cough Worsening cough with decreased breath sounds in the left lower lung field. We'll get chest x-ray to evaluate for pneumonia. Have given Hycodan cough syrup as below for nighttime cough. Patient was advised of drowsiness precautions with this medication. - HYDROcodone-homatropine (HYCODAN) 5-1.5 MG/5ML syrup; Take 5 mLs by mouth every 8 (eight) hours as needed.  Dispense: 180 mL; Refill: 0 - DG Chest 2 View; Future  3. Left-sided chest wall pain See above medical treatment plan. - DG Chest 2 View; Future        Mar Daring, PA-C  [facility]

## 2016-04-02 NOTE — Telephone Encounter (Signed)
-----   Message from Mar Daring, [place] sent at 04/02/2016  1:47 PM EST ----- Normal CXR

## 2016-04-02 NOTE — Telephone Encounter (Signed)
Patient has been advised. KW 

## 2016-04-13 ENCOUNTER — Ambulatory Visit: Payer: BLUE CROSS/BLUE SHIELD | Admitting: Physician Assistant

## 2016-04-19 NOTE — Discharge Instructions (Signed)

## 2016-04-20 ENCOUNTER — Ambulatory Visit
Admission: RE | Admit: 2016-04-20 | Discharge: 2016-04-20 | Disposition: A | Discharge status: Home / Self Care | Payer: BLUE CROSS/BLUE SHIELD | Source: Ambulatory Visit | Attending: Gastroenterology | Admitting: Gastroenterology

## 2016-04-20 ENCOUNTER — Ambulatory Visit: Payer: BLUE CROSS/BLUE SHIELD | Admitting: Anesthesiology

## 2016-04-20 ENCOUNTER — Encounter
Admission: RE | Disposition: A | Discharge status: Home / Self Care | Payer: Self-pay | Source: Ambulatory Visit | Attending: Gastroenterology

## 2016-04-20 DIAGNOSIS — I10 Essential (primary) hypertension: Secondary | ICD-10-CM | POA: Diagnosis not present

## 2016-04-20 DIAGNOSIS — Z6834 Body mass index (BMI) 34.0-34.9, adult: Secondary | ICD-10-CM | POA: Diagnosis not present

## 2016-04-20 DIAGNOSIS — E559 Vitamin D deficiency, unspecified: Secondary | ICD-10-CM | POA: Diagnosis not present

## 2016-04-20 DIAGNOSIS — Z7982 Long term (current) use of aspirin: Secondary | ICD-10-CM | POA: Insufficient documentation

## 2016-04-20 DIAGNOSIS — D649 Anemia, unspecified: Secondary | ICD-10-CM | POA: Diagnosis not present

## 2016-04-20 DIAGNOSIS — Z860100 Personal history of colon polyps, unspecified: Secondary | ICD-10-CM

## 2016-04-20 DIAGNOSIS — Z79899 Other long term (current) drug therapy: Secondary | ICD-10-CM | POA: Diagnosis not present

## 2016-04-20 DIAGNOSIS — M1990 Unspecified osteoarthritis, unspecified site: Secondary | ICD-10-CM | POA: Diagnosis not present

## 2016-04-20 DIAGNOSIS — Z800 Family history of malignant neoplasm of digestive organs: Secondary | ICD-10-CM | POA: Insufficient documentation

## 2016-04-20 DIAGNOSIS — E669 Obesity, unspecified: Secondary | ICD-10-CM | POA: Diagnosis not present

## 2016-04-20 DIAGNOSIS — D123 Benign neoplasm of transverse colon: Secondary | ICD-10-CM

## 2016-04-20 DIAGNOSIS — F17210 Nicotine dependence, cigarettes, uncomplicated: Secondary | ICD-10-CM | POA: Diagnosis not present

## 2016-04-20 DIAGNOSIS — Z1211 Encounter for screening for malignant neoplasm of colon: Secondary | ICD-10-CM | POA: Insufficient documentation

## 2016-04-20 DIAGNOSIS — K641 Second degree hemorrhoids: Secondary | ICD-10-CM | POA: Diagnosis not present

## 2016-04-20 DIAGNOSIS — E785 Hyperlipidemia, unspecified: Secondary | ICD-10-CM | POA: Diagnosis not present

## 2016-04-20 DIAGNOSIS — Z85828 Personal history of other malignant neoplasm of skin: Secondary | ICD-10-CM | POA: Insufficient documentation

## 2016-04-20 HISTORY — DX: Palpitations: R00.2

## 2016-04-20 HISTORY — PX: POLYPECTOMY: SHX5525

## 2016-04-20 HISTORY — DX: Unspecified osteoarthritis, unspecified site: M19.90

## 2016-04-20 HISTORY — PX: COLONOSCOPY WITH PROPOFOL: SHX5780

## 2016-04-20 SURGERY — COLONOSCOPY WITH PROPOFOL
Anesthesia: Monitor Anesthesia Care | Wound class: Contaminated

## 2016-04-20 MED ORDER — LIDOCAINE HCL (CARDIAC) 20 MG/ML IV SOLN
INTRAVENOUS | Status: DC | PRN
  Administered 2016-04-20: 50 mg via INTRAVENOUS

## 2016-04-20 MED ORDER — LACTATED RINGERS IV SOLN
INTRAVENOUS | Status: DC | PRN
  Administered 2016-04-20: 09:00:00 via INTRAVENOUS

## 2016-04-20 MED ORDER — PROPOFOL 10 MG/ML IV BOLUS
INTRAVENOUS | Status: DC | PRN
  Administered 2016-04-20 (×5): 50 mg via INTRAVENOUS

## 2016-04-20 MED ORDER — LACTATED RINGERS IV SOLN: INTRAVENOUS | Status: DC

## 2016-04-20 MED ORDER — ACETAMINOPHEN 160 MG/5ML PO SOLN: 325.0000 mg | ORAL | Status: DC | PRN

## 2016-04-20 MED ORDER — STERILE WATER FOR IRRIGATION IR SOLN
Status: DC | PRN
  Administered 2016-04-20: 09:00:00

## 2016-04-20 MED ORDER — ACETAMINOPHEN 325 MG PO TABS: 325.0000 mg | ORAL_TABLET | ORAL | Status: DC | PRN

## 2016-04-20 SURGICAL SUPPLY — 23 items

## 2016-04-20 NOTE — Op Note (Signed)
[facility] Patient Name: Nicole Copeland Procedure Date: 04/20/2016 9:09 AM MRN: [id number] Account #: [id number] Date of Birth: 04/22/1961 Admit Type: Outpatient Age: 55 Room: [facility] OR ROOM 01 Gender: Female Note Status: Finalized Procedure:            Colonoscopy Indications:          High risk colon cancer surveillance: Personal history                        of colonic polyps, Family history of colon cancer in a                        first-degree relative Providers:            Lucilla Lame MD, MD Referring MD:         Mar Daring (Referring MD) Medicines:            Propofol per Anesthesia Complications:        No immediate complications. Procedure:            Pre-Anesthesia Assessment:                       - Prior to the procedure, a History and Physical was                        performed, and patient medications and allergies were                        reviewed. The patient's tolerance of previous                        anesthesia was also reviewed. The risks and benefits of                        the procedure and the sedation options and risks were                        discussed with the patient. All questions were                        answered, and informed consent was obtained. Prior                        Anticoagulants: The patient has taken no previous                        anticoagulant or antiplatelet agents. ASA Grade                        Assessment: II - A patient with mild systemic disease.                        After reviewing the risks and benefits, the patient was                        deemed in satisfactory condition to undergo the                        procedure.  After obtaining informed consent, the colonoscope was                        passed under direct vision. Throughout the procedure,                        the patient's blood pressure, pulse, and oxygen     saturations were monitored continuously. The was                        introduced through the anus and advanced to the the                        cecum, identified by appendiceal orifice and ileocecal                        valve. The colonoscopy was performed without                        difficulty. The patient tolerated the procedure well.                        The quality of the bowel preparation was excellent. Findings:      The perianal and digital rectal examinations were normal.      A 3 mm polyp was found in the transverse colon. The polyp was sessile.       The polyp was removed with a cold biopsy forceps. Resection and       retrieval were complete.      Non-bleeding internal hemorrhoids were found during retroflexion. The       hemorrhoids were Grade II (internal hemorrhoids that prolapse but reduce       spontaneously). Impression:           - One 3 mm polyp in the transverse colon, removed with                        a cold biopsy forceps. Resected and retrieved.                       - Non-bleeding internal hemorrhoids. Recommendation:       - Discharge patient to home.                       - Resume previous diet.                       - Continue present medications.                       - Repeat colonoscopy in 5 years for surveillance. Procedure Code(s):    --- Professional ---                       [phone], Colonoscopy, flexible; with biopsy, single or                        multiple Diagnosis Code(s):    --- Professional ---                       Z86.010, Personal history of colonic polyps  D12.3, Benign neoplasm of transverse colon (hepatic                        flexure or splenic flexure) CPT copyright 2016 American Medical Association. All rights reserved. The codes documented in this report are preliminary and upon coder review may  be revised to meet current compliance requirements. Lucilla Lame MD, MD 04/20/2016 9:36:53 AM This report  has been signed electronically. Number of Addenda: 0 Note Initiated On: 04/20/2016 9:09 AM Scope Withdrawal Time: 0 hours 6 minutes 33 seconds  Total Procedure Duration: 0 hours 14 minutes 20 seconds       [facility]

## 2016-04-20 NOTE — Anesthesia Procedure Notes (Signed)
Procedure Name: MAC Performed by: Tanae Petrosky Pre-anesthesia Checklist: Patient identified, Emergency Drugs available, Suction available, Timeout performed and Patient being monitored Patient Re-evaluated:Patient Re-evaluated prior to inductionOxygen Delivery Method: Nasal cannula Placement Confirmation: positive ETCO2     

## 2016-04-20 NOTE — H&P (Signed)
Lucilla Lame, MD [facility] [address] [place], [place] [address] Phone: [phone] Fax : [phone]  Primary Care Physician:  Mar Daring, PA-C Primary Gastroenterologist:  Dr. Allen Norris  Pre-Procedure History & Physical: HPI:  Nicole Copeland is a 55 y.o. female is here for an colonoscopy.   Past Medical History:  Diagnosis Date  . Anemia   . Arthritis   . Cancer ([place]) 2014   basal cell skin cancer  . Hyperlipidemia   . Hypertension   . Obesity   . Palpitations    patient states it was related to caffeine intake  . Vitamin D deficiency     Past Surgical History:  Procedure Laterality Date  . COLONOSCOPY    . Status post endometrial ablation  2009 or 2010   not sure  . TUBAL LIGATION  1991    Prior to Admission medications   Medication Sig Start Date End Date Taking? Authorizing Provider  ascorbic acid (VITAMIN C) 500 MG tablet Take by mouth.    Historical Provider, MD  aspirin 81 MG tablet Take by mouth.    Historical Provider, MD  calcium citrate-vitamin D (CITRACAL+D) 315-200 MG-UNIT tablet Take by mouth.    Historical Provider, MD  hydrochlorothiazide (HYDRODIURIL) 25 MG tablet TAKE 1 TABLET (25 MG TOTAL) BY MOUTH DAILY. 12/05/15   Mar Daring, PA-C  HYDROcodone-homatropine (HYCODAN) 5-1.5 MG/5ML syrup Take 5 mLs by mouth every 8 (eight) hours as needed. Patient not taking: Reported on 04/17/2016 04/02/16   Mar Daring, PA-C  ibuprofen (MOTRIN IB) 200 MG tablet Take by mouth.    Historical Provider, MD  INDERAL XL 80 MG 24 hr capsule TAKE ONE CAPSULE BY MOUTH EVERY DAY 12/05/15   Mar Daring, PA-C  levofloxacin (LEVAQUIN) 750 MG tablet Take 1 tablet (750 mg total) by mouth daily. Patient not taking: Reported on 04/17/2016 04/02/16   Mar Daring, PA-C  Multiple Vitamins-Minerals (WOMENS 50+ [place] VITAMIN/MIN) TABS Take by mouth.    Historical Provider, MD  sertraline (ZOLOFT) 50 MG tablet TAKE 1 TABLET EVERY DAY 12/05/15    Mar Daring, PA-C    Allergies as of 03/23/2016 - Review Complete 03/23/2016  Allergen Reaction Noted  . Penicillins  03/03/2015    Family History  Problem Relation Age of Onset  . CAD Mother   . Congestive Heart Failure Mother   . Hypertension Mother   . Cancer Mother     colon  . CAD Father   . Diabetes Father   . Congestive Heart Failure Father   . Hypertension Maternal Grandmother   . Diabetes Paternal Grandfather   . Diabetes Sister   . Kidney disease Sister   . Celiac disease Sister   . Osteoarthritis Sister     Social History   Social History  . Marital status: Married    Spouse name: N/A  . Number of children: 2  . Years of education: N/A   Occupational History  . Not on file.   Social History Main Topics  . Smoking status: Current Some Day Smoker    Types: Cigarettes  . Smokeless tobacco: Never Used     Comment: smoked for 20-25 years, previously smoked 1 PPD. Quit in 2007  . Alcohol use Yes     Comment: Occasional  . Drug use: No  . Sexual activity: Not on file   Other Topics Concern  . Not on file   Social History Narrative  . No narrative on file  Review of Systems: See HPI, otherwise negative ROS  Physical Exam: There were no vitals taken for this visit. General:   Alert,  pleasant and cooperative in NAD Head:  Normocephalic and atraumatic. Neck:  Supple; no masses or thyromegaly. Lungs:  Clear throughout to auscultation.    Heart:  Regular rate and rhythm. Abdomen:  Soft, nontender and nondistended. Normal bowel sounds, without guarding, and without rebound.   Neurologic:  Alert and  oriented x4;  grossly normal neurologically.  Impression/Plan: Nicole Copeland is here for an colonoscopy to be performed for family history of colon cancer and personal history of colon polyps  Risks, benefits, limitations, and alternatives regarding  colonoscopy have been reviewed with the patient.  Questions have been answered.  All parties  agreeable.   Lucilla Lame, MD  04/20/2016, 8:34 AM

## 2016-04-20 NOTE — Transfer of Care (Signed)
Immediate Anesthesia Transfer of Care Note  Patient: Nicole Copeland  Procedure(s) Performed: Procedure(s): COLONOSCOPY WITH PROPOFOL (N/A) POLYPECTOMY  Patient Location: PACU  Anesthesia Type: MAC  Level of Consciousness: awake, alert  and patient cooperative  Airway and Oxygen Therapy: Patient Spontanous Breathing and Patient connected to supplemental oxygen  Post-op Assessment: Post-op Vital signs reviewed, Patient's Cardiovascular Status Stable, Respiratory Function Stable, Patent Airway and No signs of Nausea or vomiting  Post-op Vital Signs: Reviewed and stable  Complications: No apparent anesthesia complications

## 2016-04-20 NOTE — Anesthesia Postprocedure Evaluation (Signed)
Anesthesia Post Note  Patient: Nicole Copeland  Procedure(s) Performed: Procedure(s) (LRB): COLONOSCOPY WITH PROPOFOL (N/A) POLYPECTOMY  Patient location during evaluation: PACU Anesthesia Type: MAC Level of consciousness: awake and alert and oriented Pain management: satisfactory to patient Vital Signs Assessment: post-procedure vital signs reviewed and stable Respiratory status: spontaneous breathing, nonlabored ventilation and respiratory function stable Cardiovascular status: blood pressure returned to baseline and stable Postop Assessment: Adequate PO intake and No signs of nausea or vomiting Anesthetic complications: no    [name]

## 2016-04-20 NOTE — Anesthesia Preprocedure Evaluation (Signed)
Anesthesia Evaluation  Patient identified by MRN, date of birth, ID band Patient awake    Reviewed: Allergy & Precautions, H&P , NPO status , Patient's Chart, lab work & pertinent test results  Airway Mallampati: II  TM Distance: >3 FB Neck ROM: full    Dental no notable dental hx.    Pulmonary Current Smoker,    Pulmonary exam normal        Cardiovascular hypertension, Normal cardiovascular exam     Neuro/Psych    GI/Hepatic   Endo/Other    Renal/GU      Musculoskeletal   Abdominal   Peds  Hematology   Anesthesia Other Findings   Reproductive/Obstetrics                             Anesthesia Physical Anesthesia Plan  ASA: II  Anesthesia Plan: MAC   Post-op Pain Management:    Induction:   Airway Management Planned:   Additional Equipment:   Intra-op Plan:   Post-operative Plan:   Informed Consent: I have reviewed the patients History and Physical, chart, labs and discussed the procedure including the risks, benefits and alternatives for the proposed anesthesia with the patient or authorized representative who has indicated his/her understanding and acceptance.     Plan Discussed with:   Anesthesia Plan Comments:         Anesthesia Quick Evaluation  

## 2016-04-23 ENCOUNTER — Encounter: Payer: Self-pay | Admitting: Gastroenterology

## 2016-04-24 ENCOUNTER — Encounter: Payer: Self-pay | Admitting: Gastroenterology

## 2016-04-24 ENCOUNTER — Telehealth: Payer: Self-pay | Admitting: Physician Assistant

## 2016-04-24 NOTE — Telephone Encounter (Signed)
If she is having fever she should be tested for flu to make sure she is treated appropriately.

## 2016-04-24 NOTE — Telephone Encounter (Signed)
Pt stated that she thinks she is getting sick again. Pt LOV 04/02/16. Pt stated last night she started running a fever of 101.2, cough, congestion, & sore throat. Pt was hoping to see if Tawanna Sat would call something in so she didn't have to come out in the cold weather. [facility]. Please advise. Thanks TNP

## 2016-04-24 NOTE — Telephone Encounter (Signed)
lmtcb to schedule appointment. Renaldo Fiddler, CMA

## 2016-04-26 ENCOUNTER — Encounter: Payer: Self-pay | Admitting: Gastroenterology

## 2016-05-01 ENCOUNTER — Ambulatory Visit
Admission: RE | Admit: 2016-05-01 | Discharge: 2016-05-01 | Disposition: A | Discharge status: Home / Self Care | Payer: BLUE CROSS/BLUE SHIELD | Source: Ambulatory Visit | Attending: Physician Assistant | Admitting: Physician Assistant

## 2016-05-01 DIAGNOSIS — Z1231 Encounter for screening mammogram for malignant neoplasm of breast: Secondary | ICD-10-CM | POA: Diagnosis not present

## 2016-05-02 ENCOUNTER — Telehealth: Payer: Self-pay

## 2016-05-02 NOTE — Telephone Encounter (Signed)
Patient advised as directed below.  Thanks,  -Makeila Yamaguchi 

## 2016-05-02 NOTE — Telephone Encounter (Signed)
lmtcb Alfredo Spong Drozdowski, CMA  

## 2016-05-02 NOTE — Telephone Encounter (Signed)
-----   Message from Mar Daring, [place] sent at 05/01/2016  6:55 PM EST ----- Normal mammogram. Repeat screening in one year.

## 2016-05-15 ENCOUNTER — Other Ambulatory Visit: Payer: Self-pay

## 2016-05-15 DIAGNOSIS — I10 Essential (primary) hypertension: Secondary | ICD-10-CM

## 2016-05-15 DIAGNOSIS — F39 Unspecified mood [affective] disorder: Secondary | ICD-10-CM

## 2016-05-15 NOTE — Telephone Encounter (Signed)
Pharmacy requesting refill of 90 day supply. Last ov 04/02/16 last filled 12/05/15. Please review. Thank you. sd

## 2016-05-16 MED ORDER — SERTRALINE HCL 50 MG PO TABS: 50.0000 mg | ORAL_TABLET | Freq: Every day | ORAL | 1 refills | Status: DC

## 2016-05-16 MED ORDER — HYDROCHLOROTHIAZIDE 25 MG PO TABS: ORAL_TABLET | ORAL | 1 refills | Status: DC

## 2016-07-01 ENCOUNTER — Other Ambulatory Visit: Payer: Self-pay | Admitting: Physician Assistant

## 2016-07-01 DIAGNOSIS — I10 Essential (primary) hypertension: Secondary | ICD-10-CM

## 2016-12-02 ENCOUNTER — Other Ambulatory Visit: Payer: Self-pay | Admitting: Physician Assistant

## 2016-12-02 DIAGNOSIS — F39 Unspecified mood [affective] disorder: Secondary | ICD-10-CM

## 2016-12-02 DIAGNOSIS — I10 Essential (primary) hypertension: Secondary | ICD-10-CM

## 2016-12-10 ENCOUNTER — Other Ambulatory Visit: Payer: Self-pay | Admitting: Physician Assistant

## 2016-12-10 DIAGNOSIS — I10 Essential (primary) hypertension: Secondary | ICD-10-CM

## 2016-12-25 ENCOUNTER — Encounter: Payer: Self-pay | Admitting: Physician Assistant

## 2016-12-25 ENCOUNTER — Ambulatory Visit (INDEPENDENT_AMBULATORY_CARE_PROVIDER_SITE_OTHER): Payer: BLUE CROSS/BLUE SHIELD | Admitting: Physician Assistant

## 2016-12-25 VITALS — BP 100/78 | HR 69 | Temp 98.3°F | Resp 16 | Wt 218.4 lb

## 2016-12-25 DIAGNOSIS — J0110 Acute frontal sinusitis, unspecified: Secondary | ICD-10-CM

## 2016-12-25 DIAGNOSIS — N390 Urinary tract infection, site not specified: Secondary | ICD-10-CM | POA: Diagnosis not present

## 2016-12-25 LAB — POCT URINALYSIS DIPSTICK
Bilirubin, UA: NEGATIVE
Blood, UA: NEGATIVE
Glucose, UA: NEGATIVE
Ketones, UA: NEGATIVE
Leukocytes, UA: NEGATIVE
Nitrite, UA: NEGATIVE
Protein, UA: NEGATIVE
Spec Grav, UA: 1.01 (ref 1.010–1.025)
Urobilinogen, UA: 0.2 E.U./dL
pH, UA: 6 (ref 5.0–8.0)

## 2016-12-25 MED ORDER — DOXYCYCLINE HYCLATE 100 MG PO TABS: 100.0000 mg | ORAL_TABLET | Freq: Two times a day (BID) | ORAL | 0 refills | Status: AC

## 2016-12-25 NOTE — Progress Notes (Signed)
Patient: Nicole Copeland Female    DOB: 11/21/1960   56 y.o.   MRN: [id number] Visit Date: 12/25/2016  Today's Provider: Trinna Post, PA-C   Chief Complaint  Patient presents with  . Cough  . Urinary Tract Infection   Subjective:    Cough  This is a chronic problem. The current episode started 1 to 4 weeks ago (She reports this is more than her usual cough. It started like a sinus infection two weeks ago). The problem has been waxing and waning. The problem occurs every few hours. The cough is productive of sputum (yellowish. She reports cough is worse at night and in the morning.). Associated symptoms include chills, ear pain (right ear pain), nasal congestion, postnasal drip, a sore throat and wheezing. Pertinent negatives include no chest pain, ear congestion, fever, headaches, rhinorrhea or shortness of breath. The symptoms are aggravated by other (when she gets active, 'like going up the stairs"). Risk factors for lung disease include smoking/tobacco exposure. She has tried rest and OTC cough suppressant (Zyrtec, Claritin, Mucinex and Robitussin) for the symptoms. The treatment provided no relief.  Urinary Tract Infection   This is a new problem. The current episode started in the past 7 days (Started Saturday). The problem occurs intermittently. The problem has been unchanged. The quality of the pain is described as burning (frequency). The patient is experiencing no pain. There has been no fever. She is sexually active. There is no history of pyelonephritis. Associated symptoms include chills, frequency and urgency (just on Saturday). Pertinent negatives include no discharge, hematuria, nausea or vomiting. She has tried increased fluids for the symptoms. The treatment provided mild relief. There is no history of kidney stones.   Currently not having urinary tract symptoms, just one episode of dysuria several days ago.     Allergies  Allergen Reactions  . Penicillins   .  Sulfa Antibiotics Rash     Current Outpatient Prescriptions:  .  ascorbic acid (VITAMIN C) 500 MG tablet, Take by mouth., Disp: , Rfl:  .  aspirin 81 MG tablet, Take by mouth., Disp: , Rfl:  .  calcium citrate-vitamin D (CITRACAL+D) 315-200 MG-UNIT tablet, Take by mouth., Disp: , Rfl:  .  hydrochlorothiazide (HYDRODIURIL) 25 MG tablet, TAKE 1 TABLET (25 MG TOTAL) BY MOUTH DAILY., Disp: 90 tablet, Rfl: 1 .  INDERAL XL 80 MG 24 hr capsule, TAKE ONE CAPSULE BY MOUTH EVERY DAY, Disp: 30 capsule, Rfl: 5 .  Multiple Vitamins-Minerals (WOMENS 50+ MULTI VITAMIN/MIN) TABS, Take by mouth., Disp: , Rfl:  .  sertraline (ZOLOFT) 50 MG tablet, TAKE 1 TABLET (50 MG TOTAL) BY MOUTH DAILY. (Patient taking differently: Take 50 mg by mouth daily. ), Disp: 90 tablet, Rfl: 1 .  doxycycline (VIBRA-TABS) 100 MG tablet, Take 1 tablet (100 mg total) by mouth 2 (two) times daily., Disp: 20 tablet, Rfl: 0 .  HYDROcodone-homatropine (HYCODAN) 5-1.5 MG/5ML syrup, Take 5 mLs by mouth every 8 (eight) hours as needed. (Patient not taking: Reported on 04/17/2016), Disp: 180 mL, Rfl: 0 .  ibuprofen (MOTRIN IB) 200 MG tablet, Take by mouth., Disp: , Rfl:  .  levofloxacin (LEVAQUIN) 750 MG tablet, Take 1 tablet (750 mg total) by mouth daily. (Patient not taking: Reported on 04/17/2016), Disp: 7 tablet, Rfl: 0  Review of Systems  Constitutional: Positive for chills and fatigue. Negative for fever.  HENT: Positive for congestion, ear pain (right ear pain), postnasal drip, sneezing, sore throat and voice  change. Negative for hearing loss, rhinorrhea, sinus pain, sinus pressure and trouble swallowing.   Respiratory: Positive for cough and wheezing. Negative for chest tightness and shortness of breath.   Cardiovascular: Negative for chest pain, palpitations and leg swelling.  Gastrointestinal: Negative for nausea and vomiting.  Genitourinary: Positive for frequency and urgency (just on Saturday). Negative for hematuria.  Neurological:  Negative for dizziness, light-headedness and headaches.    Social History  Substance Use Topics  . Smoking status: Current Some Day Smoker    Types: Cigarettes  . Smokeless tobacco: Never Used     Comment: smoked for 20-25 years, previously smoked 1 PPD. Quit in 2007  . Alcohol use Yes     Comment: Occasional   Objective:   BP 100/78 (BP Location: Left Arm, Patient Position: Sitting, Cuff Size: Normal)   Pulse 69   Temp 98.3 F (36.8 C) (Oral)   Resp 16   Wt 218 lb 6.4 oz (99.1 kg)   SpO2 97%   BMI 36.91 kg/m     Physical Exam  Constitutional: She is oriented to person, place, and time. She appears well-developed and well-nourished.  HENT:  Right Ear: External ear normal.  Left Ear: External ear normal.  Nose: Right sinus exhibits maxillary sinus tenderness and frontal sinus tenderness. Left sinus exhibits maxillary sinus tenderness and frontal sinus tenderness.  Eyes: Conjunctivae are normal.  Neck: Neck supple.  Cardiovascular: Normal rate and regular rhythm.   Pulmonary/Chest: Effort normal and breath sounds normal.  Some rhonchi that clear with coughing.  Abdominal: Soft. Bowel sounds are normal. There is no tenderness.  Lymphadenopathy:    She has no cervical adenopathy.  Neurological: She is alert and oriented to person, place, and time.  Skin: Skin is warm and dry.  Psychiatric: She has a normal mood and affect. Her behavior is normal.        Assessment & Plan:     1. Acute non-recurrent frontal sinusitis  - doxycycline (VIBRA-TABS) 100 MG tablet; Take 1 tablet (100 mg total) by mouth 2 (two) times daily.  Dispense: 20 tablet; Refill: 0  2. Urinary tract infection without hematuria, site unspecified  Urinalysis normal. In absence of symptoms, will not culture.  - POCT urinalysis dipstick  Return if symptoms worsen or fail to improve.  The entirety of the information documented in the History of Present Illness, Review of Systems and Physical Exam were  personally obtained by me. Portions of this information were initially documented by Ashley Royalty, CMA and reviewed by me for thoroughness and accuracy.   I have spent 25 minutes with this patient, >50% of which was spent on counseling and coordination of care.       Trinna Post, PA-C  [facility]

## 2016-12-25 NOTE — Patient Instructions (Addendum)
  Delsym over the counter  Sinusitis, Adult Sinusitis is soreness and inflammation of your sinuses. Sinuses are hollow spaces in the bones around your face. They are located:  Around your eyes.  In the middle of your forehead.  Behind your nose.  In your cheekbones.  Your sinuses and nasal passages are lined with a stringy fluid (mucus). Mucus normally drains out of your sinuses. When your nasal tissues get inflamed or swollen, the mucus can get trapped or blocked so air cannot flow through your sinuses. This lets bacteria, viruses, and funguses grow, and that leads to infection. Follow these instructions at home: Medicines  Take, use, or apply over-the-counter and prescription medicines only as told by your doctor. These may include nasal sprays.  If you were prescribed an antibiotic medicine, take it as told by your doctor. Do not stop taking the antibiotic even if you start to feel better. Hydrate and Humidify  Drink enough water to keep your pee (urine) clear or pale yellow.  Use a cool mist humidifier to keep the humidity level in your home above 50%.  Breathe in steam for 10-15 minutes, 3-4 times a day or as told by your doctor. You can do this in the bathroom while a hot shower is running.  Try not to spend time in cool or dry air. Rest  Rest as much as possible.  Sleep with your head raised (elevated).  Make sure to get enough sleep each night. General instructions  Put a warm, moist washcloth on your face 3-4 times a day or as told by your doctor. This will help with discomfort.  Wash your hands often with soap and water. If there is no soap and water, use hand sanitizer.  Do not smoke. Avoid being around people who are smoking (secondhand smoke).  Keep all follow-up visits as told by your doctor. This is important. Contact a doctor if:  You have a fever.  Your symptoms get worse.  Your symptoms do not get better within 10 days. Get help right away  if:  You have a very bad headache.  You cannot stop throwing up (vomiting).  You have pain or swelling around your face or eyes.  You have trouble seeing.  You feel confused.  Your neck is stiff.  You have trouble breathing. This information is not intended to replace advice given to you by your health care provider. Make sure you discuss any questions you have with your health care provider. Document Released: 10/17/2007 Document Revised: 12/25/2015 Document Reviewed: 02/23/2015 Elsevier Interactive Patient Education  [employer].

## 2017-02-11 ENCOUNTER — Telehealth: Payer: Self-pay

## 2017-02-11 NOTE — Telephone Encounter (Signed)
Patient called office requesting call back from physician when available. Patient would not go into detail about issue/concern. KW

## 2017-02-13 NOTE — Telephone Encounter (Signed)
Called patient again without answer.

## 2017-02-13 NOTE — Telephone Encounter (Signed)
Called patient and left VM for her to return call.

## 2017-02-14 NOTE — Telephone Encounter (Signed)
Called patient 3rd time without answer and left VM. Advised patient to call office if still needed. Will close this encounter since have not been able to reach patient x 3 attempts.

## 2017-03-18 ENCOUNTER — Other Ambulatory Visit: Payer: Self-pay | Admitting: Physician Assistant

## 2017-03-18 DIAGNOSIS — Z1231 Encounter for screening mammogram for malignant neoplasm of breast: Secondary | ICD-10-CM

## 2017-03-22 ENCOUNTER — Encounter: Payer: Self-pay | Admitting: Physician Assistant

## 2017-03-22 ENCOUNTER — Ambulatory Visit (INDEPENDENT_AMBULATORY_CARE_PROVIDER_SITE_OTHER): Payer: BLUE CROSS/BLUE SHIELD | Admitting: Physician Assistant

## 2017-03-22 VITALS — BP 110/70 | HR 64 | Temp 98.2°F | Resp 16 | Ht 65.0 in | Wt 218.0 lb

## 2017-03-22 DIAGNOSIS — F39 Unspecified mood [affective] disorder: Secondary | ICD-10-CM | POA: Diagnosis not present

## 2017-03-22 DIAGNOSIS — R350 Frequency of micturition: Secondary | ICD-10-CM

## 2017-03-22 DIAGNOSIS — Z0000 Encounter for general adult medical examination without abnormal findings: Secondary | ICD-10-CM | POA: Diagnosis not present

## 2017-03-22 DIAGNOSIS — E7800 Pure hypercholesterolemia, unspecified: Secondary | ICD-10-CM

## 2017-03-22 DIAGNOSIS — N952 Postmenopausal atrophic vaginitis: Secondary | ICD-10-CM

## 2017-03-22 DIAGNOSIS — Z6836 Body mass index (BMI) 36.0-36.9, adult: Secondary | ICD-10-CM | POA: Diagnosis not present

## 2017-03-22 DIAGNOSIS — I10 Essential (primary) hypertension: Secondary | ICD-10-CM | POA: Diagnosis not present

## 2017-03-22 DIAGNOSIS — R3915 Urgency of urination: Secondary | ICD-10-CM

## 2017-03-22 LAB — POCT URINALYSIS DIPSTICK
Bilirubin, UA: NEGATIVE
Blood, UA: NEGATIVE
Glucose, UA: NEGATIVE
Ketones, UA: NEGATIVE
Leukocytes, UA: NEGATIVE
Nitrite, UA: NEGATIVE
Protein, UA: NEGATIVE
Spec Grav, UA: 1.02 (ref 1.010–1.025)
Urobilinogen, UA: 0.2 E.U./dL
pH, UA: 7.5 (ref 5.0–8.0)

## 2017-03-22 MED ORDER — ESTROGENS, CONJUGATED 0.625 MG/GM VA CREA: 1.0000 | TOPICAL_CREAM | Freq: Every day | VAGINAL | 3 refills | Status: DC

## 2017-03-22 NOTE — Patient Instructions (Signed)
Health Maintenance for Postmenopausal Women Menopause is a normal process in which your reproductive ability comes to an end. This process happens gradually over a span of months to years, usually between the ages of 22 and 9. Menopause is complete when you have missed 12 consecutive menstrual periods. It is important to talk with your health care provider about some of the most common conditions that affect postmenopausal women, such as heart disease, cancer, and bone loss (osteoporosis). Adopting a healthy lifestyle and getting preventive care can help to promote your health and wellness. Those actions can also lower your chances of developing some of these common conditions. What should I know about menopause? During menopause, you may experience a number of symptoms, such as:  Moderate-to-severe hot flashes.  Night sweats.  Decrease in sex drive.  Mood swings.  Headaches.  Tiredness.  Irritability.  Memory problems.  Insomnia.  Choosing to treat or not to treat menopausal changes is an individual decision that you make with your health care provider. What should I know about hormone replacement therapy and supplements? Hormone therapy products are effective for treating symptoms that are associated with menopause, such as hot flashes and night sweats. Hormone replacement carries certain risks, especially as you become older. If you are thinking about using estrogen or estrogen with progestin treatments, discuss the benefits and risks with your health care provider. What should I know about heart disease and stroke? Heart disease, heart attack, and stroke become more likely as you age. This may be due, in part, to the hormonal changes that your body experiences during menopause. These can affect how your body processes dietary fats, triglycerides, and cholesterol. Heart attack and stroke are both medical emergencies. There are many things that you can do to help prevent heart disease  and stroke:  Have your blood pressure checked at least every 1-2 years. High blood pressure causes heart disease and increases the risk of stroke.  If you are 53-22 years old, ask your health care provider if you should take aspirin to prevent a heart attack or a stroke.  Do not use any tobacco products, including cigarettes, chewing tobacco, or electronic cigarettes. If you need help quitting, ask your health care provider.  It is important to eat a healthy diet and maintain a healthy weight. ? Be sure to include plenty of vegetables, fruits, low-fat dairy products, and lean protein. ? Avoid eating foods that are high in solid fats, added sugars, or salt (sodium).  Get regular exercise. This is one of the most important things that you can do for your health. ? Try to exercise for at least 150 minutes each week. The type of exercise that you do should increase your heart rate and make you sweat. This is known as moderate-intensity exercise. ? Try to do strengthening exercises at least twice each week. Do these in addition to the moderate-intensity exercise.  Know your numbers.Ask your health care provider to check your cholesterol and your blood glucose. Continue to have your blood tested as directed by your health care provider.  What should I know about cancer screening? There are several types of cancer. Take the following steps to reduce your risk and to catch any cancer development as early as possible. Breast Cancer  Practice breast self-awareness. ? This means understanding how your breasts normally appear and feel. ? It also means doing regular breast self-exams. Let your health care provider know about any changes, no matter how small.  If you are 40  or older, have a clinician do a breast exam (clinical breast exam or CBE) every year. Depending on your age, family history, and medical history, it may be recommended that you also have a yearly breast X-ray (mammogram).  If you  have a family history of breast cancer, talk with your health care provider about genetic screening.  If you are at high risk for breast cancer, talk with your health care provider about having an MRI and a mammogram every year.  Breast cancer (BRCA) gene test is recommended for women who have family members with BRCA-related cancers. Results of the assessment will determine the need for genetic counseling and BRCA1 and for BRCA2 testing. BRCA-related cancers include these types: ? Breast. This occurs in males or females. ? Ovarian. ? Tubal. This may also be called fallopian tube cancer. ? Cancer of the abdominal or pelvic lining (peritoneal cancer). ? Prostate. ? Pancreatic.  Cervical, Uterine, and Ovarian Cancer Your health care provider may recommend that you be screened regularly for cancer of the pelvic organs. These include your ovaries, uterus, and vagina. This screening involves a pelvic exam, which includes checking for microscopic changes to the surface of your cervix (Pap test).  For women ages 21-65, health care providers may recommend a pelvic exam and a Pap test every three years. For women ages 79-65, they may recommend the Pap test and pelvic exam, combined with testing for human papilloma virus (HPV), every five years. Some types of HPV increase your risk of cervical cancer. Testing for HPV may also be done on women of any age who have unclear Pap test results.  Other health care providers may not recommend any screening for nonpregnant women who are considered low risk for pelvic cancer and have no symptoms. Ask your health care provider if a screening pelvic exam is right for you.  If you have had past treatment for cervical cancer or a condition that could lead to cancer, you need Pap tests and screening for cancer for at least 20 years after your treatment. If Pap tests have been discontinued for you, your risk factors (such as having a new sexual partner) need to be  reassessed to determine if you should start having screenings again. Some women have medical problems that increase the chance of getting cervical cancer. In these cases, your health care provider may recommend that you have screening and Pap tests more often.  If you have a family history of uterine cancer or ovarian cancer, talk with your health care provider about genetic screening.  If you have vaginal bleeding after reaching menopause, tell your health care provider.  There are currently no reliable tests available to screen for ovarian cancer.  Lung Cancer Lung cancer screening is recommended for adults 69-62 years old who are at high risk for lung cancer because of a history of smoking. A yearly low-dose CT scan of the lungs is recommended if you:  Currently smoke.  Have a history of at least 30 pack-years of smoking and you currently smoke or have quit within the past 15 years. A pack-year is smoking an average of one pack of cigarettes per day for one year.  Yearly screening should:  Continue until it has been 15 years since you quit.  Stop if you develop a health problem that would prevent you from having lung cancer treatment.  Colorectal Cancer  This type of cancer can be detected and can often be prevented.  Routine colorectal cancer screening usually begins at  age 42 and continues through age 45.  If you have risk factors for colon cancer, your health care provider may recommend that you be screened at an earlier age.  If you have a family history of colorectal cancer, talk with your health care provider about genetic screening.  Your health care provider may also recommend using home test kits to check for hidden blood in your stool.  A small camera at the end of a tube can be used to examine your colon directly (sigmoidoscopy or colonoscopy). This is done to check for the earliest forms of colorectal cancer.  Direct examination of the colon should be repeated every  5-10 years until age 71. However, if early forms of precancerous polyps or small growths are found or if you have a family history or genetic risk for colorectal cancer, you may need to be screened more often.  Skin Cancer  Check your skin from head to toe regularly.  Monitor any moles. Be sure to tell your health care provider: ? About any new moles or changes in moles, especially if there is a change in a mole's shape or color. ? If you have a mole that is larger than the size of a pencil eraser.  If any of your family members has a history of skin cancer, especially at a young age, talk with your health care provider about genetic screening.  Always use sunscreen. Apply sunscreen liberally and repeatedly throughout the day.  Whenever you are outside, protect yourself by wearing long sleeves, pants, a wide-brimmed hat, and sunglasses.  What should I know about osteoporosis? Osteoporosis is a condition in which bone destruction happens more quickly than new bone creation. After menopause, you may be at an increased risk for osteoporosis. To help prevent osteoporosis or the bone fractures that can happen because of osteoporosis, the following is recommended:  If you are 46-71 years old, get at least 1,000 mg of calcium and at least 600 mg of vitamin D per day.  If you are older than age 55 but younger than age 65, get at least 1,200 mg of calcium and at least 600 mg of vitamin D per day.  If you are older than age 54, get at least 1,200 mg of calcium and at least 800 mg of vitamin D per day.  Smoking and excessive alcohol intake increase the risk of osteoporosis. Eat foods that are rich in calcium and vitamin D, and do weight-bearing exercises several times each week as directed by your health care provider. What should I know about how menopause affects my mental health? Depression may occur at any age, but it is more common as you become older. Common symptoms of depression  include:  Low or sad mood.  Changes in sleep patterns.  Changes in appetite or eating patterns.  Feeling an overall lack of motivation or enjoyment of activities that you previously enjoyed.  Frequent crying spells.  Talk with your health care provider if you think that you are experiencing depression. What should I know about immunizations? It is important that you get and maintain your immunizations. These include:  Tetanus, diphtheria, and pertussis (Tdap) booster vaccine.  Influenza every year before the flu season begins.  Pneumonia vaccine.  Shingles vaccine.  Your health care provider may also recommend other immunizations. This information is not intended to replace advice given to you by your health care provider. Make sure you discuss any questions you have with your health care provider. Document Released: 06/22/2005  Document Revised: 11/18/2015 Document Reviewed: 02/01/2015 Elsevier Interactive Patient Education  2018 [employer]  

## 2017-03-22 NOTE — Progress Notes (Signed)
Patient: Nicole Copeland, Female    DOB: 1960/06/15, 56 y.o.   MRN: [id number] Visit Date: 03/22/2017  Today's Provider: Mar Daring, PA-C   Chief Complaint  Patient presents with  . Annual Exam   Subjective:    Annual physical exam Nicole Copeland is a 56 y.o. female who presents today for health maintenance and complete physical. She feels well. She reports exercising. She reports she is sleeping well.  CPE:03/20/16 Pap:03/10/15 Negative Mammogram:05/01/16 BI-RADS 1-Mammogram scheduled 05/03/2017 Colonoscopy:04/20/16 Polyps, recheck in 5 years Flu Vaccine:Patient reports she got it at work 02/2017 -----------------------------------------------------------------   Review of Systems  Constitutional: Negative.   HENT: Positive for congestion, sinus pressure and sneezing.   Eyes: Negative.   Respiratory: Positive for cough, chest tightness and wheezing.   Cardiovascular: Negative.   Gastrointestinal: Negative.   Endocrine: Positive for polyuria.  Genitourinary: Positive for urgency.  Musculoskeletal: Positive for arthralgias, back pain and joint swelling.  Skin: Negative.   Allergic/Immunologic: Negative.   Neurological: Positive for headaches.  Hematological: Negative.   Psychiatric/Behavioral: Negative.     Social History      She  reports that she has been smoking cigarettes.  she has never used smokeless tobacco. She reports that she drinks alcohol. She reports that she does not use drugs.       Social History   Socioeconomic History  . Marital status: Married    Spouse name: None  . Number of children: 2  . Years of education: None  . Highest education level: None  Social Needs  . Financial resource strain: None  . Food insecurity - worry: None  . Food insecurity - inability: None  . Transportation needs - medical: None  . Transportation needs - non-medical: None  Occupational History  . None  Tobacco Use  . Smoking status: Current Some  Day Smoker    Types: Cigarettes  . Smokeless tobacco: Never Used  . Tobacco comment: smoked for 20-25 years, previously smoked 1 PPD. Quit in 2007  Substance and Sexual Activity  . Alcohol use: Yes    Comment: Occasional  . Drug use: No  . Sexual activity: None  Other Topics Concern  . None  Social History Narrative  . None    Past Medical History:  Diagnosis Date  . Anemia   . Arthritis   . Cancer ([place]) 2014   basal cell skin cancer  . Hyperlipidemia   . Hypertension   . Obesity   . Palpitations    patient states it was related to caffeine intake  . Vitamin D deficiency      Patient Active Problem List   Diagnosis Date Noted  . Hx of colonic polyps   . Benign neoplasm of transverse colon   . Avitaminosis D 03/11/2015  . Adiposity 03/11/2015  . Menopausal and postmenopausal disorder 03/11/2015  . Joint stiffness 03/11/2015  . Basal cell carcinoma 03/11/2015  . Family history of colon cancer 03/03/2015  . Acute stress disorder 03/03/2015  . Hypertension 10/19/2014  . 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 Pure hypercholesterolemia 06/15/2008  . Episodic mood disorder ([place]) 06/15/2008  . Neoplasm of uncertain behavior of skin 10/09/2006  . Atheroma, skin 10/03/2006    Past Surgical History:  Procedure Laterality Date  . COLONOSCOPY    . Status post endometrial ablation  2009 or 2010   not sure  . TUBAL LIGATION  1991    Family History  Family Status  Relation Name Status  . Mother  Deceased at age 28       Colon Cancer, chronic lung disease  . Father  Deceased  . MGM  Deceased       died from a stroke  . MGF  Deceased       died from cancer. Unknown Type  . PGM  Deceased       died in a house fire  . PGF  Deceased       died from congestive heart failure  . Sister  Deceased at age 82       kidney   . Daughter  Alive  . Son  Alive  . Sister  Alive  . Sister  Alive  . Daughter  Alive  . Daughter  Alive        Her family history includes CAD in  her father and mother; Cancer in her mother; Celiac disease in her sister; Congestive Heart Failure in her father and mother; Diabetes in her father, paternal grandfather, and sister; Hypertension in her maternal grandmother and mother; Kidney disease in her sister; Osteoarthritis in her sister.     Allergies  Allergen Reactions  . Penicillins   . Sulfa Antibiotics Rash     Current Outpatient Medications:  .  aspirin 81 MG tablet, Take by mouth., Disp: , Rfl:  .  hydrochlorothiazide (HYDRODIURIL) 25 MG tablet, TAKE 1 TABLET (25 MG TOTAL) BY MOUTH DAILY., Disp: 90 tablet, Rfl: 1 .  INDERAL XL 80 MG 24 hr capsule, TAKE ONE CAPSULE BY MOUTH EVERY DAY, Disp: 30 capsule, Rfl: 5 .  Multiple Vitamins-Minerals (WOMENS 50+ MULTI VITAMIN/MIN) TABS, Take by mouth., Disp: , Rfl:  .  sertraline (ZOLOFT) 50 MG tablet, TAKE 1 TABLET (50 MG TOTAL) BY MOUTH DAILY. (Patient taking differently: Take 50 mg by mouth daily. ), Disp: 90 tablet, Rfl: 1 .  HYDROcodone-homatropine (HYCODAN) 5-1.5 MG/5ML syrup, Take 5 mLs by mouth every 8 (eight) hours as needed. (Patient not taking: Reported on 04/17/2016), Disp: 180 mL, Rfl: 0 .  ibuprofen (MOTRIN IB) 200 MG tablet, Take by mouth., Disp: , Rfl:  .  levofloxacin (LEVAQUIN) 750 MG tablet, Take 1 tablet (750 mg total) by mouth daily. (Patient not taking: Reported on 04/17/2016), Disp: 7 tablet, Rfl: 0   Patient Care Team: Mar Daring, PA-C as PCP - General (Family Medicine)      Objective:   Vitals: BP 110/70 (BP Location: Left Arm, Patient Position: Sitting, Cuff Size: Normal)   Pulse 64   Temp 98.2 F (36.8 C) (Oral)   Resp 16   Ht 5\' 5"  (1.651 m)   Wt 218 lb (98.9 kg)   BMI 36.28 kg/m    Physical Exam  Constitutional: She is oriented to person, place, and time. She appears well-developed and well-nourished. No distress.  HENT:  Head: Normocephalic and atraumatic.  Right Ear: Hearing, tympanic membrane, external ear and ear canal normal.  Left  Ear: Hearing, tympanic membrane, external ear and ear canal normal.  Nose: Nose normal.  Mouth/Throat: Uvula is midline, oropharynx is clear and moist and mucous membranes are normal. No oropharyngeal exudate.  Eyes: Conjunctivae and EOM are normal. Pupils are equal, round, and reactive to light. Right eye exhibits no discharge. Left eye exhibits no discharge. No scleral icterus.  Neck: Normal range of motion. Neck supple. No JVD present. Carotid bruit is not present. No tracheal deviation present. No thyromegaly present.  Cardiovascular: Normal rate, regular rhythm,  normal heart sounds and intact distal pulses. Exam reveals no gallop and no friction rub.  No murmur heard. Pulmonary/Chest: Effort normal and breath sounds normal. No respiratory distress. She has no wheezes. She has no rales. She exhibits no tenderness. Right breast exhibits no inverted nipple, no mass, no nipple discharge, no skin change and no tenderness. Left breast exhibits no inverted nipple, no mass, no nipple discharge, no skin change and no tenderness. Breasts are symmetrical.  Abdominal: Soft. Bowel sounds are normal. She exhibits no distension and no mass. There is no tenderness. There is no rebound and no guarding.  Musculoskeletal: Normal range of motion. She exhibits no edema or tenderness.  Lymphadenopathy:    She has no cervical adenopathy.  Neurological: She is alert and oriented to person, place, and time.  Skin: Skin is warm and dry. No rash noted. She is not diaphoretic.  Psychiatric: She has a normal mood and affect. Her behavior is normal. Judgment and thought content normal.  Vitals reviewed.    Depression Screen PHQ 2/9 Scores 03/22/2017 03/20/2016  PHQ - 2 Score 0 0      Assessment & Plan:     Routine Health Maintenance and Physical Exam  Exercise Activities and Dietary recommendations Goals    None      Immunization History  Administered Date(s) Administered  . Influenza,inj,Quad PF,6+ Mos  03/11/2015  . Tdap 03/23/2011    Health Maintenance  Topic Date Due  . INFLUENZA VACCINE  12/12/2016  . PAP SMEAR  03/09/2018  . MAMMOGRAM  05/01/2018  . COLONOSCOPY  04/21/2019  . TETANUS/TDAP  03/22/2021  . Hepatitis C Screening  Completed  . HIV Screening  Completed     Discussed health benefits of physical activity, and encouraged her to engage in regular exercise appropriate for her age and condition.    1. Annual physical exam Normal physical exam today. Will check labs as below and f/u pending lab results. If labs are stable and WNL she will not need to have these rechecked for one year at her next annual physical exam. She is to call the office in the meantime if she has any acute issue, questions or concerns. - CBC with Differential/Platelet - Comprehensive metabolic panel - Hemoglobin A1c - TSH  2. Pure hypercholesterolemia Stable. Diet controlled. Will check labs as below and f/u pending results. - Lipid panel  3. Essential hypertension Stable on Inderal 80mg  and HCTZ 25mg . Will check labs as below and f/u pending results. - Comprehensive metabolic panel - Hemoglobin A1c  4. Frequency of urination UA normal today. Suspect symptoms from vaginal atrophy.  - POCT urinalysis dipstick  5. Urgency of urination See above medical treatment plan. - POCT urinalysis dipstick  6. Vaginal atrophy Will do trial of topical premarin as below. She is to call if no improvements.  - conjugated estrogens (PREMARIN) vaginal cream; Place 1 Applicatorful daily vaginally. X 1 week, then decrease to M-W-F application  Dispense: 03.0 g; Refill: 3  7. Episodic mood disorder (HCC) Stable on Sertraline 25mg  daily (half of 50mg ).   8. BMI 36.0-36.9,adult Started walking more. Counseled patient on healthy lifestyle modifications including dieting and exercise.   --------------------------------------------------------------------    Mar Daring, PA-C  [facility]

## 2017-03-23 LAB — LIPID PANEL
Cholesterol: 200 mg/dL — ABNORMAL HIGH (ref <200)
HDL: 48 mg/dL — ABNORMAL LOW (ref >50)
LDL Cholesterol (Calc): 121 mg/dL (calc) — ABNORMAL HIGH
Non-HDL Cholesterol (Calc): 152 mg/dL (calc) — ABNORMAL HIGH (ref <130)
Total CHOL/HDL Ratio: 4.2 (calc) (ref <5.0)
Triglycerides: 194 mg/dL — ABNORMAL HIGH (ref <150)

## 2017-03-23 LAB — COMPLETE METABOLIC PANEL WITH GFR
AG Ratio: 1.4 (calc) (ref 1.0–2.5)
ALT: 17 U/L (ref 6–29)
AST: 21 U/L (ref 10–35)
Albumin: 4.3 g/dL (ref 3.6–5.1)
Alkaline phosphatase (APISO): 77 U/L (ref 33–130)
BUN: 14 mg/dL (ref 7–25)
CO2: 28 mmol/L (ref 20–32)
Calcium: 9.6 mg/dL (ref 8.6–10.4)
Chloride: 103 mmol/L (ref 98–110)
Creat: 0.82 mg/dL (ref 0.50–1.05)
GFR, Est African American: 93 mL/min/{1.73_m2} (ref >=60)
GFR, Est Non African American: 81 mL/min/{1.73_m2} (ref >=60)
Globulin: 3 g/dL (calc) (ref 1.9–3.7)
Glucose, Bld: 80 mg/dL (ref 65–99)
Potassium: 4.4 mmol/L (ref 3.5–5.3)
Sodium: 139 mmol/L (ref 135–146)
Total Bilirubin: 0.5 mg/dL (ref 0.2–1.2)
Total Protein: 7.3 g/dL (ref 6.1–8.1)

## 2017-03-23 LAB — HEMOGLOBIN A1C
Hgb A1c MFr Bld: 5.3 % of total Hgb (ref <5.7)
Mean Plasma Glucose: 105 (calc)
eAG (mmol/L): 5.8 (calc)

## 2017-03-23 LAB — CBC WITH DIFFERENTIAL/PLATELET
Basophils Absolute: 50 cells/uL (ref 0–200)
Basophils Relative: 0.7 %
Eosinophils Absolute: 302 cells/uL (ref 15–500)
Eosinophils Relative: 4.2 %
HCT: 38 % (ref 35.0–45.0)
Hemoglobin: 12.3 g/dL (ref 11.7–15.5)
Lymphs Abs: 2275 cells/uL (ref 850–3900)
MCH: 26.5 pg — ABNORMAL LOW (ref 27.0–33.0)
MCHC: 32.4 g/dL (ref 32.0–36.0)
MCV: 81.9 fL (ref 80.0–100.0)
MPV: 10 fL (ref 7.5–12.5)
Monocytes Relative: 8.6 %
Neutro Abs: 3953 cells/uL (ref 1500–7800)
Neutrophils Relative %: 54.9 %
Platelets: 335 10*3/uL (ref 140–400)
RBC: 4.64 10*6/uL (ref 3.80–5.10)
RDW: 13.2 % (ref 11.0–15.0)
Total Lymphocyte: 31.6 %
WBC mixed population: 619 cells/uL (ref 200–950)
WBC: 7.2 10*3/uL (ref 3.8–10.8)

## 2017-03-23 LAB — TSH: TSH: 2.13 mIU/L

## 2017-03-25 ENCOUNTER — Telehealth: Payer: Self-pay

## 2017-03-25 NOTE — Telephone Encounter (Signed)
Patient advised as directed below.  Thanks,  -Joseline 

## 2017-03-25 NOTE — Telephone Encounter (Signed)
-----   Message from Mar Daring, [place] sent at 03/25/2017 11:04 AM EST ----- Cholesterol still borderline high but improved from last year. All other labs are WNL and stable.

## 2017-03-29 ENCOUNTER — Encounter: Payer: Self-pay | Admitting: Physician Assistant

## 2017-03-29 ENCOUNTER — Ambulatory Visit: Payer: BLUE CROSS/BLUE SHIELD | Admitting: Physician Assistant

## 2017-03-29 VITALS — BP 110/70 | HR 71 | Temp 97.8°F | Resp 16 | Wt 219.0 lb

## 2017-03-29 DIAGNOSIS — L918 Other hypertrophic disorders of the skin: Secondary | ICD-10-CM

## 2017-03-29 NOTE — Patient Instructions (Signed)
Cryoablation Cryoablation is a procedure used to remove abnormal growths or cancerous tissue. This is done by freezing the growth or tissue with either liquid nitrogen or argon gas. This procedure may be done to treat many conditions, including:  Skin tumors.  Non-cancerous (benign) nodules.  A type of eye cancer (retinoblastoma).  Cancers of the prostate, liver, kidney, cervix, lung, and bone.  Tell a health care provider about:  Any allergies you have.  All medicines you are taking, including vitamins, herbs, eye drops, creams, and over-the-counter medicines.  Any problems you or family members have had with anesthetic medicines.  Any blood disorders you have.  Any surgeries you have had.  Any medical conditions you have.  Whether you are pregnant or may be pregnant. What are the risks? Generally, this is a safe procedure. However, problems may occur, including:  Infection.  Bleeding.  Swelling.  Nerve damage and loss of feeling. This is rare.  Allergic reactions to medicines.  Damage to other structures or organs.  What happens before the procedure? Staying hydrated Follow instructions from your health care provider about hydration, which may include:  Up to 2 hours before the procedure - you may continue to drink clear liquids, such as water, clear fruit juice, black coffee, and plain tea.  Eating and drinking restrictions Follow instructions from your health care provider about eating and drinking, which may include:  8 hours before the procedure - stop eating heavy meals or foods such as meat, fried foods, or fatty foods.  6 hours before the procedure - stop eating light meals or foods, such as toast or cereal.  6 hours before the procedure - stop drinking milk or drinks that contain milk.  2 hours before the procedure - stop drinking clear liquids.  Medicines  Ask your health care provider about: ? Changing or stopping your regular medicines. This  is especially important if you are taking diabetes medicines or blood thinners. ? Taking medicines such as aspirin and ibuprofen. These medicines can thin your blood. Do not take these medicines before your procedure if your health care provider instructs you not to.  You may be given antibiotic medicine to help prevent infection. General instructions  You may have blood tests.  Plan to have someone take you home from the hospital or clinic.  If you will be going home right after the procedure, plan to have someone with you for 24 hours.  Ask your health care provider how your surgical site will be marked or identified. What happens during the procedure?  To reduce your risk of infection: ? Your health care team will wash or sanitize their hands. ? Your skin will be washed with soap. ? Hair may be removed from the surgical area.  An IV tube will be inserted into one of your veins.  You will be given one or more of the following: ? A medicine to help you relax (sedative). ? A medicine to numb the area (local anesthetic). ? A medicine to make you fall asleep (general anesthetic). ? A medicine that is injected into your spine to numb the area below and slightly above the injection site (spinal anesthetic). ? A medicine that is injected into an area of your body to numb everything below the injection site (regional anesthetic).  Depending on the location of the growth, a small incision may be made. A bronchoscope may be used for a lung tumor, or a laparoscope may be used for an abdominal tumor.  Your  health care provider may use a device (cryoprobe) that has liquid nitrogen or argon gas flowing through it. The cryoprobe will be inserted through the incision to the area where the tumor is located. This may be done with guidance from imaging such as an ultrasound, CT scan, or MRI scan. °· Liquid nitrogen or argon gas will be delivered through the cryoprobe to the growth until it is frozen  and destroyed. °· The process may be repeated on other areas depending on how many areas need treatment. °· The cryoprobe will be removed and pressure will be applied to stop any bleeding. °· The incision will be closed with stitches (sutures). °· The incision will be covered with a bandage (dressing). °The procedure will vary depending on the location of the tumor or nodule. The procedure may also vary among health care providers and hospitals. °What happens after the procedure? °· Do not drive for 24 hours if you received a sedative. °· Your blood pressure, heart rate, breathing rate, and blood oxygen level will be monitored until the medicines you were given have worn off. °· You will be given medicine to help with pain, nausea, and vomiting as needed. °This information is not intended to replace advice given to you by your health care provider. Make sure you discuss any questions you have with your health care provider. °Document Released: 02/18/2013 Document Revised: 11/18/2015 Document Reviewed: 09/28/2015 °Elsevier Interactive Patient Education © 2018 [employer] ° °

## 2017-03-29 NOTE — Progress Notes (Signed)
       Patient: Nicole Copeland Female    DOB: [birth year]   56 y.o.   MRN: [id number] Visit Date: 03/29/2017  Today's Provider: Mar Daring, PA-C   Chief Complaint  Patient presents with  . skin tag Removal   Subjective:    HPI Patient is here for skin tag removal. She has a few skin tags under her breast line that hit her bra and irritate her.     Allergies  Allergen Reactions  . Penicillins   . Sulfa Antibiotics Rash     Current Outpatient Medications:  .  aspirin 81 MG tablet, Take by mouth., Disp: , Rfl:  .  conjugated estrogens (PREMARIN) vaginal cream, Place 1 Applicatorful daily vaginally. X 1 week, then decrease to M-W-F application, Disp: 56.3 g, Rfl: 3 .  hydrochlorothiazide (HYDRODIURIL) 25 MG tablet, TAKE 1 TABLET (25 MG TOTAL) BY MOUTH DAILY., Disp: 90 tablet, Rfl: 1 .  INDERAL XL 80 MG 24 hr capsule, TAKE ONE CAPSULE BY MOUTH EVERY DAY, Disp: 30 capsule, Rfl: 5 .  Multiple Vitamins-Minerals (WOMENS 50+ MULTI VITAMIN/MIN) TABS, Take by mouth., Disp: , Rfl:  .  sertraline (ZOLOFT) 50 MG tablet, TAKE 1 TABLET (50 MG TOTAL) BY MOUTH DAILY. (Patient taking differently: Take 50 mg by mouth daily. ), Disp: 90 tablet, Rfl: 1 .  ibuprofen (MOTRIN IB) 200 MG tablet, Take by mouth., Disp: , Rfl:   Review of Systems  Constitutional: Negative.   Respiratory: Negative.   Cardiovascular: Negative.   Gastrointestinal: Negative.   Skin:       Skin tags  Neurological: Negative.     Social History   Tobacco Use  . Smoking status: Current Some Day Smoker    Types: Cigarettes  . Smokeless tobacco: Never Used  . Tobacco comment: smoked for 20-25 years, previously smoked 1 PPD. Quit in 2007  Substance Use Topics  . Alcohol use: Yes    Comment: Occasional   Objective:   BP 110/70 (BP Location: Left Arm, Patient Position: Sitting, Cuff Size: Large)   Pulse 71   Temp 97.8 F (36.6 C) (Oral)   Resp 16   Wt 219 lb (99.3 kg)   BMI 36.44 kg/m    Physical Exam   Skin:     Vitals reviewed.       Assessment & Plan:     1. Skin tag Cryotherapy performed on all 8 skin tags. She is to call if they do not fall off and will remove them with a blade.        Mar Daring, PA-C  [facility]

## 2017-04-12 ENCOUNTER — Ambulatory Visit: Payer: BLUE CROSS/BLUE SHIELD | Admitting: Physician Assistant

## 2017-04-12 NOTE — Progress Notes (Deleted)
       Patient: Nicole Copeland Female    DOB: February 11, 1961   56 y.o.   MRN: [id number] Visit Date: 04/12/2017  Today's Provider: Mar Daring, PA-C   No chief complaint on file.  Subjective:    HPI Patient here today C/O hip pain    Allergies  Allergen Reactions  . Penicillins   . Sulfa Antibiotics Rash     Current Outpatient Medications:  .  aspirin 81 MG tablet, Take by mouth., Disp: , Rfl:  .  conjugated estrogens (PREMARIN) vaginal cream, Place 1 Applicatorful daily vaginally. X 1 week, then decrease to M-W-F application, Disp: 36.1 g, Rfl: 3 .  hydrochlorothiazide (HYDRODIURIL) 25 MG tablet, TAKE 1 TABLET (25 MG TOTAL) BY MOUTH DAILY., Disp: 90 tablet, Rfl: 1 .  ibuprofen (MOTRIN IB) 200 MG tablet, Take by mouth., Disp: , Rfl:  .  INDERAL XL 80 MG 24 hr capsule, TAKE ONE CAPSULE BY MOUTH EVERY DAY, Disp: 30 capsule, Rfl: 5 .  Multiple Vitamins-Minerals (WOMENS 50+ MULTI VITAMIN/MIN) TABS, Take by mouth., Disp: , Rfl:  .  sertraline (ZOLOFT) 50 MG tablet, TAKE 1 TABLET (50 MG TOTAL) BY MOUTH DAILY. (Patient taking differently: Take 50 mg by mouth daily. ), Disp: 90 tablet, Rfl: 1  Review of Systems  Social History   Tobacco Use  . Smoking status: Current Some Day Smoker    Types: Cigarettes  . Smokeless tobacco: Never Used  . Tobacco comment: smoked for 20-25 years, previously smoked 1 PPD. Quit in 2007  Substance Use Topics  . Alcohol use: Yes    Comment: Occasional   Objective:   There were no vitals taken for this visit. There were no vitals filed for this visit.   Physical Exam      Assessment & Plan:           Mar Daring, PA-C  [facility]

## 2017-06-14 ENCOUNTER — Other Ambulatory Visit: Payer: Self-pay | Admitting: Physician Assistant

## 2017-06-14 DIAGNOSIS — I10 Essential (primary) hypertension: Secondary | ICD-10-CM

## 2017-06-14 DIAGNOSIS — F39 Unspecified mood [affective] disorder: Secondary | ICD-10-CM

## 2017-09-10 ENCOUNTER — Telehealth: Payer: Self-pay

## 2017-09-10 NOTE — Telephone Encounter (Signed)
Prior authorization for Inderal XL requested by covermymeds.  Information processed and waiting decision.

## 2017-10-01 ENCOUNTER — Telehealth: Payer: Self-pay | Admitting: Physician Assistant

## 2017-10-01 DIAGNOSIS — I10 Essential (primary) hypertension: Secondary | ICD-10-CM

## 2017-10-01 NOTE — Telephone Encounter (Signed)
Pt called saying insurance is not going to be covering her blood pressure medication Inderal XL any longer.  She said it is going to have to be changed to something else.  She uses [facility]  Pt's call back is [phone]  Thanks teri

## 2017-10-02 MED ORDER — PROPRANOLOL HCL ER 80 MG PO CP24: 80.0000 mg | ORAL_CAPSULE | Freq: Every day | ORAL | 1 refills | Status: DC

## 2017-10-02 NOTE — Telephone Encounter (Signed)
[id number] 

## 2017-10-02 NOTE — Telephone Encounter (Signed)
Pt called back and said yes she will use the generic if insurance will cover  teri

## 2017-10-02 NOTE — Telephone Encounter (Signed)
Please review and advise. KW 

## 2017-10-02 NOTE — Telephone Encounter (Signed)
Does she want to see if the generic of this medication is covered?

## 2017-10-02 NOTE — Telephone Encounter (Signed)
Sent in generic for her to try. Please let me know if not covered.

## 2017-12-25 ENCOUNTER — Other Ambulatory Visit: Payer: Self-pay | Admitting: Physician Assistant

## 2017-12-25 DIAGNOSIS — I10 Essential (primary) hypertension: Secondary | ICD-10-CM

## 2017-12-25 DIAGNOSIS — F39 Unspecified mood [affective] disorder: Secondary | ICD-10-CM

## 2018-03-27 ENCOUNTER — Other Ambulatory Visit: Payer: Self-pay | Admitting: Physician Assistant

## 2018-03-27 DIAGNOSIS — I10 Essential (primary) hypertension: Secondary | ICD-10-CM

## 2018-03-27 MED ORDER — PROPRANOLOL HCL ER 80 MG PO CP24
80.0000 mg | ORAL_CAPSULE | Freq: Every day | ORAL | 1 refills | Status: DC
Start: 2018-03-27 — End: 2018-09-29

## 2018-03-27 NOTE — Telephone Encounter (Signed)
refilled 

## 2018-03-27 NOTE — Telephone Encounter (Signed)
Patient was advised.  

## 2018-03-27 NOTE — Telephone Encounter (Signed)
[facility] faxed refill request for the following medications:  propranolol ER (INDERAL LA) 80 MG 24 hr capsule  90 day supply  Last Rx: 10/02/17 LOV: 03/29/17 NOV: 04/18/18 Please advise. Thanks TNP

## 2018-04-11 ENCOUNTER — Encounter: Payer: BLUE CROSS/BLUE SHIELD | Admitting: Physician Assistant

## 2018-04-18 ENCOUNTER — Encounter: Payer: Self-pay | Admitting: Physician Assistant

## 2018-05-28 NOTE — Progress Notes (Deleted)
Patient: Nicole Copeland, Female    DOB: [birth year], 58 y.o.   MRN: [id number] Visit Date: 05/28/2018  Today's Provider: Mar Daring, PA-C   No chief complaint on file.  Subjective:    Annual physical exam Nicole Copeland is a 58 y.o. female who presents today for health maintenance and complete physical. She feels {DESC; WELL/FAIRLY WELL/POORLY:18703}. She reports exercising ***. She reports she is sleeping {DESC; WELL/FAIRLY WELL/POORLY:18703}.  -----------------------------------------------------------------   Review of Systems  Social History      She  reports that she has been smoking cigarettes. She has never used smokeless tobacco. She reports current alcohol use. She reports that she does not use drugs.       Social History   Socioeconomic History  . Marital status: Married    Spouse name: Not on file  . Number of children: 2  . Years of education: Not on file  . Highest education level: Not on file  Occupational History  . Not on file  Social Needs  . Financial resource strain: Not on file  . Food insecurity:    Worry: Not on file    Inability: Not on file  . Transportation needs:    Medical: Not on file    Non-medical: Not on file  Tobacco Use  . Smoking status: Current Some Day Smoker    Types: Cigarettes  . Smokeless tobacco: Never Used  . Tobacco comment: smoked for 20-25 years, previously smoked 1 PPD. Quit in 2007  Substance and Sexual Activity  . Alcohol use: Yes    Comment: Occasional  . Drug use: No  . Sexual activity: Not on file  Lifestyle  . Physical activity:    Days per week: Not on file    Minutes per session: Not on file  . Stress: Not on file  Relationships  . Social connections:    Talks on phone: Not on file    Gets together: Not on file    Attends religious service: Not on file    Active member of club or organization: Not on file    Attends meetings of clubs or organizations: Not on file    Relationship  status: Not on file  Other Topics Concern  . Not on file  Social History Narrative  . Not on file    Past Medical History:  Diagnosis Date  . Anemia   . Arthritis   . Cancer ([place]) 2014   basal cell skin cancer  . Hyperlipidemia   . Hypertension   . Obesity   . Palpitations    patient states it was related to caffeine intake  . Vitamin D deficiency      Patient Active Problem List   Diagnosis Date Noted  . Hx of colonic polyps   . Benign neoplasm of transverse colon   . Avitaminosis D 03/11/2015  . Adiposity 03/11/2015  . Menopausal and postmenopausal disorder 03/11/2015  . Joint stiffness 03/11/2015  . Basal cell carcinoma 03/11/2015  . Family history of colon cancer 03/03/2015  . Acute stress disorder 03/03/2015  . Hypertension 10/19/2014  . 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 Pure hypercholesterolemia 06/15/2008  . Episodic mood disorder ([place]) 06/15/2008  . Neoplasm of uncertain behavior of skin 10/09/2006  . Atheroma, skin 10/03/2006    Past Surgical History:  Procedure Laterality Date  . COLONOSCOPY    . COLONOSCOPY WITH PROPOFOL N/A 04/20/2016   Procedure: COLONOSCOPY WITH PROPOFOL;  Surgeon: Lucilla Lame, MD;  Location: [facility];  Service: Endoscopy;  Laterality: N/A;  . POLYPECTOMY  04/20/2016   Procedure: POLYPECTOMY;  Surgeon: Lucilla Lame, MD;  Location: [facility];  Service: Endoscopy;;  . Status post endometrial ablation  2009 or 2010   not sure  . TUBAL LIGATION  1991    Family History        Family Status  Relation Name Status  . Mother  Deceased at age 40       Colon Cancer, chronic lung disease  . Father  Deceased  . MGM  Deceased       died from a stroke  . MGF  Deceased       died from cancer. Unknown Type  . PGM  Deceased       died in a house fire  . PGF  Deceased       died from congestive heart failure  . Sister  Deceased at age 80       kidney   . Daughter  Alive  . Son  Alive  . Sister  Alive  . Sister  Alive    . Daughter  Alive  . Daughter  Alive        Her family history includes CAD in her father and mother; Cancer in her mother; Celiac disease in her sister; Congestive Heart Failure in her father and mother; Diabetes in her father, paternal grandfather, and sister; Hypertension in her maternal grandmother and mother; Kidney disease in her sister; Osteoarthritis in her sister.      Allergies  Allergen Reactions  . Penicillins   . Sulfa Antibiotics Rash     Current Outpatient Medications:  .  aspirin 81 MG tablet, Take by mouth., Disp: , Rfl:  .  conjugated estrogens (PREMARIN) vaginal cream, Place 1 Applicatorful daily vaginally. X 1 week, then decrease to M-W-F application, Disp: 96.2 g, Rfl: 3 .  hydrochlorothiazide (HYDRODIURIL) 25 MG tablet, TAKE 1 TABLET (25 MG TOTAL) BY MOUTH DAILY., Disp: 90 tablet, Rfl: 1 .  ibuprofen (MOTRIN IB) 200 MG tablet, Take by mouth., Disp: , Rfl:  .  Multiple Vitamins-Minerals (WOMENS 50+ MULTI VITAMIN/MIN) TABS, Take by mouth., Disp: , Rfl:  .  propranolol ER (INDERAL LA) 80 MG 24 hr capsule, Take 1 capsule (80 mg total) by mouth daily., Disp: 90 capsule, Rfl: 1 .  sertraline (ZOLOFT) 50 MG tablet, TAKE 1 TABLET (50 MG TOTAL) BY MOUTH DAILY., Disp: 90 tablet, Rfl: 1   Patient Care Team: Mar Daring, PA-C as PCP - General (Family Medicine)      Objective:   Vitals: There were no vitals taken for this visit.  There were no vitals filed for this visit.   Physical Exam   Depression Screen PHQ 2/9 Scores 03/22/2017 03/20/2016  PHQ - 2 Score 0 0      Assessment & Plan:     Routine Health Maintenance and Physical Exam  Exercise Activities and Dietary recommendations Goals   None     Immunization History  Administered Date(s) Administered  . Influenza,inj,Quad PF,6+ Mos 03/11/2015  . Tdap 03/23/2011    Health Maintenance  Topic Date Due  . INFLUENZA VACCINE  12/12/2017  . PAP SMEAR-Modifier  03/09/2018  . MAMMOGRAM   05/01/2018  . COLONOSCOPY  04/21/2019  . TETANUS/TDAP  03/22/2021  . Hepatitis C Screening  Completed  . HIV Screening  Completed     Discussed health benefits of physical activity, and encouraged her to engage in  regular exercise appropriate for her age and condition.    --------------------------------------------------------------------    Mar Daring, PA-C  [place]

## 2018-05-30 ENCOUNTER — Encounter: Payer: BLUE CROSS/BLUE SHIELD | Admitting: Physician Assistant

## 2018-06-06 ENCOUNTER — Other Ambulatory Visit: Payer: Self-pay

## 2018-06-06 DIAGNOSIS — F39 Unspecified mood [affective] disorder: Secondary | ICD-10-CM

## 2018-06-06 MED ORDER — SERTRALINE HCL 50 MG PO TABS: 50.0000 mg | ORAL_TABLET | Freq: Every day | ORAL | 1 refills | Status: DC

## 2018-06-06 NOTE — Telephone Encounter (Signed)
Patient called office requesting refill on Sertraline 50mg  sent to [facility]. KW

## 2018-06-29 ENCOUNTER — Other Ambulatory Visit: Payer: Self-pay | Admitting: Physician Assistant

## 2018-06-29 DIAGNOSIS — I10 Essential (primary) hypertension: Secondary | ICD-10-CM

## 2018-07-11 ENCOUNTER — Other Ambulatory Visit (HOSPITAL_COMMUNITY)
Admission: RE | Admit: 2018-07-11 | Discharge: 2018-07-11 | Disposition: A | Discharge status: Home / Self Care | Payer: BLUE CROSS/BLUE SHIELD | Source: Ambulatory Visit | Attending: Physician Assistant | Admitting: Physician Assistant

## 2018-07-11 ENCOUNTER — Encounter: Payer: Self-pay | Admitting: Physician Assistant

## 2018-07-11 ENCOUNTER — Ambulatory Visit (INDEPENDENT_AMBULATORY_CARE_PROVIDER_SITE_OTHER): Payer: BLUE CROSS/BLUE SHIELD | Admitting: Physician Assistant

## 2018-07-11 VITALS — BP 109/79 | HR 62 | Temp 98.5°F | Wt 202.0 lb

## 2018-07-11 DIAGNOSIS — E559 Vitamin D deficiency, unspecified: Secondary | ICD-10-CM

## 2018-07-11 DIAGNOSIS — E7800 Pure hypercholesterolemia, unspecified: Secondary | ICD-10-CM

## 2018-07-11 DIAGNOSIS — Z1239 Encounter for other screening for malignant neoplasm of breast: Secondary | ICD-10-CM | POA: Diagnosis not present

## 2018-07-11 DIAGNOSIS — I10 Essential (primary) hypertension: Secondary | ICD-10-CM

## 2018-07-11 DIAGNOSIS — E6609 Other obesity due to excess calories: Secondary | ICD-10-CM

## 2018-07-11 DIAGNOSIS — D508 Other iron deficiency anemias: Secondary | ICD-10-CM

## 2018-07-11 DIAGNOSIS — Z6833 Body mass index (BMI) 33.0-33.9, adult: Secondary | ICD-10-CM

## 2018-07-11 DIAGNOSIS — F39 Unspecified mood [affective] disorder: Secondary | ICD-10-CM

## 2018-07-11 DIAGNOSIS — Z0000 Encounter for general adult medical examination without abnormal findings: Secondary | ICD-10-CM | POA: Diagnosis not present

## 2018-07-11 DIAGNOSIS — Z124 Encounter for screening for malignant neoplasm of cervix: Secondary | ICD-10-CM | POA: Insufficient documentation

## 2018-07-11 DIAGNOSIS — D123 Benign neoplasm of transverse colon: Secondary | ICD-10-CM

## 2018-07-11 DIAGNOSIS — Z1211 Encounter for screening for malignant neoplasm of colon: Secondary | ICD-10-CM | POA: Diagnosis not present

## 2018-07-11 DIAGNOSIS — Z800 Family history of malignant neoplasm of digestive organs: Secondary | ICD-10-CM

## 2018-07-11 NOTE — Patient Instructions (Signed)
Health Maintenance for Postmenopausal Women Menopause is a normal process in which your reproductive ability comes to an end. This process happens gradually over a span of months to years, usually between the ages of 62 and 89. Menopause is complete when you have missed 12 consecutive menstrual periods. It is important to talk with your health care provider about some of the most common conditions that affect postmenopausal women, such as heart disease, cancer, and bone loss (osteoporosis). Adopting a healthy lifestyle and getting preventive care can help to promote your health and wellness. Those actions can also lower your chances of developing some of these common conditions. What should I know about menopause? During menopause, you may experience a number of symptoms, such as:  Moderate-to-severe hot flashes.  Night sweats.  Decrease in sex drive.  Mood swings.  Headaches.  Tiredness.  Irritability.  Memory problems.  Insomnia. Choosing to treat or not to treat menopausal changes is an individual decision that you make with your health care provider. What should I know about hormone replacement therapy and supplements? Hormone therapy products are effective for treating symptoms that are associated with menopause, such as hot flashes and night sweats. Hormone replacement carries certain risks, especially as you become older. If you are thinking about using estrogen or estrogen with progestin treatments, discuss the benefits and risks with your health care provider. What should I know about heart disease and stroke? Heart disease, heart attack, and stroke become more likely as you age. This may be due, in part, to the hormonal changes that your body experiences during menopause. These can affect how your body processes dietary fats, triglycerides, and cholesterol. Heart attack and stroke are both medical emergencies. There are many things that you can do to help prevent heart disease  and stroke:  Have your blood pressure checked at least every 1-2 years. High blood pressure causes heart disease and increases the risk of stroke.  If you are 79-72 years old, ask your health care provider if you should take aspirin to prevent a heart attack or a stroke.  Do not use any tobacco products, including cigarettes, chewing tobacco, or electronic cigarettes. If you need help quitting, ask your health care provider.  It is important to eat a healthy diet and maintain a healthy weight. ? Be sure to include plenty of vegetables, fruits, low-fat dairy products, and lean protein. ? Avoid eating foods that are high in solid fats, added sugars, or salt (sodium).  Get regular exercise. This is one of the most important things that you can do for your health. ? Try to exercise for at least 150 minutes each week. The type of exercise that you do should increase your heart rate and make you sweat. This is known as moderate-intensity exercise. ? Try to do strengthening exercises at least twice each week. Do these in addition to the moderate-intensity exercise.  Know your numbers.Ask your health care provider to check your cholesterol and your blood glucose. Continue to have your blood tested as directed by your health care provider.  What should I know about cancer screening? There are several types of cancer. Take the following steps to reduce your risk and to catch any cancer development as early as possible. Breast Cancer  Practice breast self-awareness. ? This means understanding how your breasts normally appear and feel. ? It also means doing regular breast self-exams. Let your health care provider know about any changes, no matter how small.  If you are 40 or  older, have a clinician do a breast exam (clinical breast exam or CBE) every year. Depending on your age, family history, and medical history, it may be recommended that you also have a yearly breast X-ray (mammogram).  If you  have a family history of breast cancer, talk with your health care provider about genetic screening.  If you are at high risk for breast cancer, talk with your health care provider about having an MRI and a mammogram every year.  Breast cancer (BRCA) gene test is recommended for women who have family members with BRCA-related cancers. Results of the assessment will determine the need for genetic counseling and BRCA1 and for BRCA2 testing. BRCA-related cancers include these types: ? Breast. This occurs in males or females. ? Ovarian. ? Tubal. This may also be called fallopian tube cancer. ? Cancer of the abdominal or pelvic lining (peritoneal cancer). ? Prostate. ? Pancreatic. Cervical, Uterine, and Ovarian Cancer Your health care provider may recommend that you be screened regularly for cancer of the pelvic organs. These include your ovaries, uterus, and vagina. This screening involves a pelvic exam, which includes checking for microscopic changes to the surface of your cervix (Pap test).  For women ages 21-65, health care providers may recommend a pelvic exam and a Pap test every three years. For women ages 39-65, they may recommend the Pap test and pelvic exam, combined with testing for human papilloma virus (HPV), every five years. Some types of HPV increase your risk of cervical cancer. Testing for HPV may also be done on women of any age who have unclear Pap test results.  Other health care providers may not recommend any screening for nonpregnant women who are considered low risk for pelvic cancer and have no symptoms. Ask your health care provider if a screening pelvic exam is right for you.  If you have had past treatment for cervical cancer or a condition that could lead to cancer, you need Pap tests and screening for cancer for at least 20 years after your treatment. If Pap tests have been discontinued for you, your risk factors (such as having a new sexual partner) need to be reassessed  to determine if you should start having screenings again. Some women have medical problems that increase the chance of getting cervical cancer. In these cases, your health care provider may recommend that you have screening and Pap tests more often.  If you have a family history of uterine cancer or ovarian cancer, talk with your health care provider about genetic screening.  If you have vaginal bleeding after reaching menopause, tell your health care provider.  There are currently no reliable tests available to screen for ovarian cancer. Lung Cancer Lung cancer screening is recommended for adults 57-50 years old who are at high risk for lung cancer because of a history of smoking. A yearly low-dose CT scan of the lungs is recommended if you:  Currently smoke.  Have a history of at least 30 pack-years of smoking and you currently smoke or have quit within the past 15 years. A pack-year is smoking an average of one pack of cigarettes per day for one year. Yearly screening should:  Continue until it has been 15 years since you quit.  Stop if you develop a health problem that would prevent you from having lung cancer treatment. Colorectal Cancer  This type of cancer can be detected and can often be prevented.  Routine colorectal cancer screening usually begins at age 12 and continues through  age 75.  If you have risk factors for colon cancer, your health care provider may recommend that you be screened at an earlier age.  If you have a family history of colorectal cancer, talk with your health care provider about genetic screening.  Your health care provider may also recommend using home test kits to check for hidden blood in your stool.  A small camera at the end of a tube can be used to examine your colon directly (sigmoidoscopy or colonoscopy). This is done to check for the earliest forms of colorectal cancer.  Direct examination of the colon should be repeated every 5-10 years until  age 75. However, if early forms of precancerous polyps or small growths are found or if you have a family history or genetic risk for colorectal cancer, you may need to be screened more often. Skin Cancer  Check your skin from head to toe regularly.  Monitor any moles. Be sure to tell your health care provider: ? About any new moles or changes in moles, especially if there is a change in a mole's shape or color. ? If you have a mole that is larger than the size of a pencil eraser.  If any of your family members has a history of skin cancer, especially at a young age, talk with your health care provider about genetic screening.  Always use sunscreen. Apply sunscreen liberally and repeatedly throughout the day.  Whenever you are outside, protect yourself by wearing long sleeves, pants, a wide-brimmed hat, and sunglasses. What should I know about osteoporosis? Osteoporosis is a condition in which bone destruction happens more quickly than new bone creation. After menopause, you may be at an increased risk for osteoporosis. To help prevent osteoporosis or the bone fractures that can happen because of osteoporosis, the following is recommended:  If you are 19-50 years old, get at least 1,000 mg of calcium and at least 600 mg of vitamin D per day.  If you are older than age 50 but younger than age 70, get at least 1,200 mg of calcium and at least 600 mg of vitamin D per day.  If you are older than age 70, get at least 1,200 mg of calcium and at least 800 mg of vitamin D per day. Smoking and excessive alcohol intake increase the risk of osteoporosis. Eat foods that are rich in calcium and vitamin D, and do weight-bearing exercises several times each week as directed by your health care provider. What should I know about how menopause affects my mental health? Depression may occur at any age, but it is more common as you become older. Common symptoms of depression include:  Low or sad  mood.  Changes in sleep patterns.  Changes in appetite or eating patterns.  Feeling an overall lack of motivation or enjoyment of activities that you previously enjoyed.  Frequent crying spells. Talk with your health care provider if you think that you are experiencing depression. What should I know about immunizations? It is important that you get and maintain your immunizations. These include:  Tetanus, diphtheria, and pertussis (Tdap) booster vaccine.  Influenza every year before the flu season begins.  Pneumonia vaccine.  Shingles vaccine. Your health care provider may also recommend other immunizations. This information is not intended to replace advice given to you by your health care provider. Make sure you discuss any questions you have with your health care provider. Document Released: 06/22/2005 Document Revised: 11/18/2015 Document Reviewed: 02/01/2015 Elsevier Interactive Patient Education    2019 [place].

## 2018-07-11 NOTE — Progress Notes (Signed)
Patient: Nicole Copeland, Female    DOB: Mar 06, 1961, 58 y.o.   MRN: [id number] Visit Date: 07/11/2018  Today's Provider: Mar Daring, PA-C   Chief Complaint  Patient presents with  . Annual Exam   Subjective:    Annual physical exam Nicole Copeland is a 58 y.o. female who presents today for health maintenance and complete physical. She feels well. She reports exercising includes walking. She reports she is sleeping well. -----------------------------------------------------------------   Review of Systems  Constitutional: Negative.   HENT: Negative.   Eyes: Negative.   Respiratory: Negative.   Cardiovascular: Negative.   Gastrointestinal: Negative.   Endocrine: Negative.   Genitourinary: Negative.   Musculoskeletal: Positive for back pain and joint swelling.  Skin: Negative.   Allergic/Immunologic: Negative.   Neurological: Negative.   Hematological: Negative.   Psychiatric/Behavioral: Negative.     Social History She  reports that she has been smoking cigarettes. She has never used smokeless tobacco. She reports current alcohol use. She reports that she does not use drugs. Social History   Socioeconomic History  . Marital status: Married    Spouse name: Not on file  . Number of children: 2  . Years of education: Not on file  . Highest education level: Not on file  Occupational History  . Not on file  Social Needs  . Financial resource strain: Not on file  . Food insecurity:    Worry: Not on file    Inability: Not on file  . Transportation needs:    Medical: Not on file    Non-medical: Not on file  Tobacco Use  . Smoking status: Current Some Day Smoker    Types: Cigarettes  . Smokeless tobacco: Never Used  . Tobacco comment: smoked for 20-25 years, previously smoked 1 PPD. Quit in 2007  Substance and Sexual Activity  . Alcohol use: Yes    Comment: Occasional  . Drug use: No  . Sexual activity: Not on file  Lifestyle  . Physical  activity:    Days per week: Not on file    Minutes per session: Not on file  . Stress: Not on file  Relationships  . Social connections:    Talks on phone: Not on file    Gets together: Not on file    Attends religious service: Not on file    Active member of club or organization: Not on file    Attends meetings of clubs or organizations: Not on file    Relationship status: Not on file  Other Topics Concern  . Not on file  Social History Narrative  . Not on file    Patient Active Problem List   Diagnosis Date Noted  . Hx of colonic polyps   . Benign neoplasm of transverse colon   . Avitaminosis D 03/11/2015  . Adiposity 03/11/2015  . Menopausal and postmenopausal disorder 03/11/2015  . Joint stiffness 03/11/2015  . Basal cell carcinoma 03/11/2015  . Family history of colon cancer 03/03/2015  . Acute stress disorder 03/03/2015  . Hypertension 10/19/2014  . 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 Pure hypercholesterolemia 06/15/2008  . Episodic mood disorder ([place]) 06/15/2008  . Neoplasm of uncertain behavior of skin 10/09/2006  . Atheroma, skin 10/03/2006    Past Surgical History:  Procedure Laterality Date  . COLONOSCOPY    . COLONOSCOPY WITH PROPOFOL N/A 04/20/2016   Procedure: COLONOSCOPY WITH PROPOFOL;  Surgeon: Lucilla Lame, MD;  Location: [facility];  Service: Endoscopy;  Laterality: N/A;  . POLYPECTOMY  04/20/2016   Procedure: POLYPECTOMY;  Surgeon: Lucilla Lame, MD;  Location: [facility];  Service: Endoscopy;;  . Status post endometrial ablation  2009 or 2010   not sure  . TUBAL LIGATION  1991    Family History  Family Status  Relation Name Status  . Mother  Deceased at age 107       Colon Cancer, chronic lung disease  . Father  Deceased  . MGM  Deceased       died from a stroke  . MGF  Deceased       died from cancer. Unknown Type  . PGM  Deceased       died in a house fire  . PGF  Deceased       died from congestive heart failure  . Sister   Deceased at age 46       kidney   . Daughter  Alive  . Son  Alive  . Sister  Alive  . Sister  Alive  . Daughter  Alive  . Daughter  Alive   Her family history includes CAD in her father and mother; Cancer in her mother; Celiac disease in her sister; Congestive Heart Failure in her father and mother; Diabetes in her father, paternal grandfather, and sister; Hypertension in her maternal grandmother and mother; Kidney disease in her sister; Osteoarthritis in her sister.     Allergies  Allergen Reactions  . Penicillins   . Sulfa Antibiotics Rash    Previous Medications   ASPIRIN 81 MG TABLET    Take by mouth.   CONJUGATED ESTROGENS (PREMARIN) VAGINAL CREAM    Place 1 Applicatorful daily vaginally. X 1 week, then decrease to M-W-F application   HYDROCHLOROTHIAZIDE (HYDRODIURIL) 25 MG TABLET    TAKE 1 TABLET (25 MG TOTAL) BY MOUTH DAILY.   IBUPROFEN (MOTRIN IB) 200 MG TABLET    Take by mouth.   MULTIPLE VITAMINS-MINERALS (WOMENS 50+ MULTI VITAMIN/MIN) TABS    Take by mouth.   PROPRANOLOL ER (INDERAL LA) 80 MG 24 HR CAPSULE    Take 1 capsule (80 mg total) by mouth daily.   SERTRALINE (ZOLOFT) 50 MG TABLET    Take 1 tablet (50 mg total) by mouth daily.    Patient Care Team: Mar Daring, PA-C as PCP - General (Family Medicine)      Objective:   Vitals: BP 109/79 (BP Location: Left Arm, Patient Position: Sitting, Cuff Size: Normal)   Pulse 62   Temp 98.5 F (36.9 C) (Oral)   Wt 202 lb (91.6 kg)   BMI 33.61 kg/m    Physical Exam Vitals signs reviewed.  Constitutional:      General: She is not in acute distress.    Appearance: Normal appearance. She is well-developed. She is obese. She is not diaphoretic.  HENT:     Head: Normocephalic and atraumatic.     Right Ear: Hearing, tympanic membrane, ear canal and external ear normal.     Left Ear: Hearing, tympanic membrane, ear canal and external ear normal.     Nose: Nose normal.     Mouth/Throat:     Pharynx: Uvula  midline. No oropharyngeal exudate.  Eyes:     General: No scleral icterus.       Right eye: No discharge.        Left eye: No discharge.     Conjunctiva/sclera: Conjunctivae normal.     Pupils: Pupils are equal, round, and  reactive to light.  Neck:     Musculoskeletal: Normal range of motion and neck supple.     Thyroid: No thyromegaly.     Vascular: No carotid bruit or JVD.     Trachea: No tracheal deviation.  Cardiovascular:     Rate and Rhythm: Normal rate and regular rhythm.     Heart sounds: Normal heart sounds. No murmur. No friction rub. No gallop.   Pulmonary:     Effort: Pulmonary effort is normal. No respiratory distress.     Breath sounds: Normal breath sounds. No wheezing or rales.  Chest:     Chest wall: No tenderness.     Breasts: Breasts are symmetrical.        Right: No inverted nipple, mass, nipple discharge, skin change or tenderness.        Left: No inverted nipple, mass, nipple discharge, skin change or tenderness.  Abdominal:     General: Bowel sounds are normal. There is no distension.     Palpations: Abdomen is soft. There is no mass.     Tenderness: There is no abdominal tenderness. There is no guarding or rebound.     Hernia: There is no hernia in the right inguinal area or left inguinal area.  Genitourinary:    Exam position: Supine.     Labia:        Right: No rash, tenderness, lesion or injury.        Left: No rash, tenderness, lesion or injury.      Vagina: Normal. No signs of injury. No vaginal discharge, erythema, tenderness or bleeding.     Cervix: No cervical motion tenderness, discharge or friability.     Adnexa:        Right: No mass, tenderness or fullness.         Left: No mass, tenderness or fullness.       Rectum: Normal.  Musculoskeletal: Normal range of motion.        General: No tenderness.  Lymphadenopathy:     Cervical: No cervical adenopathy.  Skin:    General: Skin is warm and dry.     Findings: No rash.  Neurological:      Mental Status: She is alert and oriented to person, place, and time.     Cranial Nerves: No cranial nerve deficit.     Coordination: Coordination normal.     Deep Tendon Reflexes: Reflexes are normal and symmetric.  Psychiatric:        Behavior: Behavior normal.        Thought Content: Thought content normal.        Judgment: Judgment normal.      Depression Screen PHQ 2/9 Scores 07/11/2018 03/22/2017 03/20/2016  PHQ - 2 Score 0 0 0  PHQ- 9 Score 0 - -      Assessment & Plan:     Routine Health Maintenance and Physical Exam  Exercise Activities and Dietary recommendations Goals   None     Immunization History  Administered Date(s) Administered  . Influenza,inj,Quad PF,6+ Mos 03/11/2015  . Tdap 03/23/2011    Health Maintenance  Topic Date Due  . INFLUENZA VACCINE  12/12/2017  . PAP SMEAR-Modifier  03/09/2018  . MAMMOGRAM  05/01/2018  . COLONOSCOPY  04/21/2019  . TETANUS/TDAP  03/22/2021  . Hepatitis C Screening  Completed  . HIV Screening  Completed     Discussed health benefits of physical activity, and encouraged her to engage in regular exercise appropriate for her age and  condition.    1. Annual physical exam Normal physical exam today. Will check labs as below and f/u pending lab results. If labs are stable and WNL she will not need to have these rechecked for one year at her next annual physical exam. She is to call the office in the meantime if she has any acute issue, questions or concerns. - CBC with Differential/Platelet - Comprehensive metabolic panel - Hemoglobin A1c - Lipid panel - TSH  2. Breast cancer screening Breast exam today was normal. There is no family history of breast cancer. She does perform regular self breast exams. Mammogram was ordered as below. Information for [facility] clinic was given to patient so she may schedule her mammogram at her convenience. - MM 3D SCREEN BREAST BILATERAL; Future  3. Cervical cancer  screening Pap collected today. Will send as below and f/u pending results. - Cytology - PAP  4. Colon cancer screening Due for repeat colonoscopy in December with Dr. Allen Norris.   5. Essential hypertension Stable. Continue HCTZ 25mg  and propranolol ER 80mg . Will check labs as below and f/u pending results. - CBC with Differential/Platelet - Comprehensive metabolic panel - Hemoglobin A1c - Lipid panel - TSH  6. Avitaminosis D H/O this. Will check labs as below and f/u pending results. - CBC with Differential/Platelet - Vitamin D (25 hydroxy)  7. Iron deficiency anemia secondary to inadequate dietary iron intake Previously stable. Will check labs as below and f/u pending results. - CBC with Differential/Platelet - Comprehensive metabolic panel  8. Pure hypercholesterolemia Diet controlled. Will check labs as below and f/u pending results. - CBC with Differential/Platelet - Comprehensive metabolic panel - Hemoglobin A1c - Lipid panel  9. Family history of colon cancer Mother  80. Benign neoplasm of transverse colon Personal history on most recent colonoscopy. Repeat in December (3 years since last).   11. Class 1 obesity due to excess calories with serious comorbidity and body mass index (BMI) of 33.0 to 33.9 in adult Counseled patient on healthy lifestyle modifications including dieting and exercise.  - Comprehensive metabolic panel - Hemoglobin A1c - Lipid panel - TSH  12. Episodic mood disorder ([facility]) Stable on sertraline 50mg . Will check labs as below and f/u pending results. - TSH  --------------------------------------------------------------------

## 2018-07-12 LAB — VITAMIN D 25 HYDROXY (VIT D DEFICIENCY, FRACTURES): Vit D, 25-Hydroxy: 32 ng/mL (ref 30.0–100.0)

## 2018-07-12 LAB — CBC WITH DIFFERENTIAL/PLATELET
Basophils Absolute: 0 10*3/uL (ref 0.0–0.2)
Basos: 1 %
EOS (ABSOLUTE): 0.2 10*3/uL (ref 0.0–0.4)
Eos: 3 %
Hematocrit: 38.9 % (ref 34.0–46.6)
Hemoglobin: 12.8 g/dL (ref 11.1–15.9)
Immature Grans (Abs): 0 10*3/uL (ref 0.0–0.1)
Immature Granulocytes: 0 %
Lymphocytes Absolute: 1.8 10*3/uL (ref 0.7–3.1)
Lymphs: 25 %
MCH: 26.5 pg — ABNORMAL LOW (ref 26.6–33.0)
MCHC: 32.9 g/dL (ref 31.5–35.7)
MCV: 81 fL (ref 79–97)
Monocytes Absolute: 0.6 10*3/uL (ref 0.1–0.9)
Monocytes: 8 %
Neutrophils Absolute: 4.5 10*3/uL (ref 1.4–7.0)
Neutrophils: 63 %
Platelets: 321 10*3/uL (ref 150–450)
RBC: 4.83 x10E6/uL (ref 3.77–5.28)
RDW: 13.8 % (ref 11.7–15.4)
WBC: 7.2 10*3/uL (ref 3.4–10.8)

## 2018-07-12 LAB — COMPREHENSIVE METABOLIC PANEL
ALT: 19 IU/L (ref 0–32)
AST: 20 IU/L (ref 0–40)
Albumin/Globulin Ratio: 1.6 (ref 1.2–2.2)
Albumin: 4.2 g/dL (ref 3.8–4.9)
Alkaline Phosphatase: 78 IU/L (ref 39–117)
BUN/Creatinine Ratio: 21 (ref 9–23)
BUN: 15 mg/dL (ref 6–24)
Bilirubin Total: 0.4 mg/dL (ref 0.0–1.2)
CO2: 22 mmol/L (ref 20–29)
Calcium: 9.4 mg/dL (ref 8.7–10.2)
Chloride: 103 mmol/L (ref 96–106)
Creatinine, Ser: 0.72 mg/dL (ref 0.57–1.00)
GFR calc Af Amer: 108 mL/min/{1.73_m2} (ref >59)
GFR calc non Af Amer: 94 mL/min/{1.73_m2} (ref >59)
Globulin, Total: 2.6 g/dL (ref 1.5–4.5)
Glucose: 72 mg/dL (ref 65–99)
Potassium: 4.2 mmol/L (ref 3.5–5.2)
Sodium: 142 mmol/L (ref 134–144)
Total Protein: 6.8 g/dL (ref 6.0–8.5)

## 2018-07-12 LAB — LIPID PANEL
Chol/HDL Ratio: 4.3 ratio (ref 0.0–4.4)
Cholesterol, Total: 204 mg/dL — ABNORMAL HIGH (ref 100–199)
HDL: 48 mg/dL (ref >39)
LDL Calculated: 127 mg/dL — ABNORMAL HIGH (ref 0–99)
Triglycerides: 144 mg/dL (ref 0–149)
VLDL Cholesterol Cal: 29 mg/dL (ref 5–40)

## 2018-07-12 LAB — TSH: TSH: 1.76 u[IU]/mL (ref 0.450–4.500)

## 2018-07-12 LAB — HEMOGLOBIN A1C
Est. average glucose Bld gHb Est-mCnc: 108 mg/dL
Hgb A1c MFr Bld: 5.4 % (ref 4.8–5.6)

## 2018-07-15 LAB — CYTOLOGY - PAP
Diagnosis: NEGATIVE
HPV: NOT DETECTED

## 2018-07-16 ENCOUNTER — Telehealth: Payer: Self-pay

## 2018-07-16 NOTE — Telephone Encounter (Signed)
Patient advised as below.  

## 2018-07-16 NOTE — Telephone Encounter (Signed)
-----   Message from Mar Daring, [place] sent at 07/15/2018  5:23 PM EST ----- All labs are within normal limits and stable. Pap is normal, HPV negative.  Will repeat in 3-5 years.

## 2018-07-28 ENCOUNTER — Telehealth: Payer: Self-pay

## 2018-07-28 ENCOUNTER — Other Ambulatory Visit: Payer: Self-pay

## 2018-07-28 ENCOUNTER — Encounter: Payer: Self-pay | Admitting: Physician Assistant

## 2018-07-28 ENCOUNTER — Ambulatory Visit: Payer: BLUE CROSS/BLUE SHIELD | Admitting: Physician Assistant

## 2018-07-28 VITALS — BP 108/75 | HR 66 | Temp 98.6°F | Resp 16 | Wt 204.4 lb

## 2018-07-28 DIAGNOSIS — J40 Bronchitis, not specified as acute or chronic: Secondary | ICD-10-CM

## 2018-07-28 MED ORDER — PREDNISONE 20 MG PO TABS: 20.0000 mg | ORAL_TABLET | Freq: Every day | ORAL | 0 refills | Status: AC

## 2018-07-28 MED ORDER — PROMETHAZINE-DM 6.25-15 MG/5ML PO SYRP: 5.0000 mL | ORAL_SOLUTION | Freq: Every evening | ORAL | 0 refills | Status: DC | PRN

## 2018-07-28 NOTE — Progress Notes (Signed)
Patient: Nicole Copeland Female    DOB: 03-Sep-1960   58 y.o.   MRN: [id number] Visit Date: 07/28/2018  Today's Provider: Trinna Post, PA-C   Chief Complaint  Patient presents with  . URI   Subjective:     URI   This is a new problem. The current episode started in the past 7 days (started with the sore throat thrusday evening). The problem has been gradually worsening. There has been no fever. Associated symptoms include congestion, coughing, rhinorrhea and wheezing. Pertinent negatives include no chest pain or sore throat. Headaches: a little yesterday. Treatments tried: Thera Flu tea. The treatment provided no relief.    Allergies  Allergen Reactions  . Penicillins   . Sulfa Antibiotics Rash     Current Outpatient Medications:  .  aspirin 81 MG tablet, Take by mouth., Disp: , Rfl:  .  conjugated estrogens (PREMARIN) vaginal cream, Place 1 Applicatorful daily vaginally. X 1 week, then decrease to M-W-F application, Disp: 41.9 g, Rfl: 3 .  hydrochlorothiazide (HYDRODIURIL) 25 MG tablet, TAKE 1 TABLET (25 MG TOTAL) BY MOUTH DAILY., Disp: 90 tablet, Rfl: 1 .  ibuprofen (MOTRIN IB) 200 MG tablet, Take by mouth., Disp: , Rfl:  .  Multiple Vitamins-Minerals (WOMENS 50+ MULTI VITAMIN/MIN) TABS, Take by mouth., Disp: , Rfl:  .  propranolol ER (INDERAL LA) 80 MG 24 hr capsule, Take 1 capsule (80 mg total) by mouth daily., Disp: 90 capsule, Rfl: 1 .  sertraline (ZOLOFT) 50 MG tablet, Take 1 tablet (50 mg total) by mouth daily., Disp: 90 tablet, Rfl: 1  Review of Systems  HENT: Positive for congestion and rhinorrhea. Negative for postnasal drip and sore throat.   Respiratory: Positive for cough, chest tightness and wheezing. Negative for shortness of breath.   Cardiovascular: Negative for chest pain, palpitations and leg swelling.  Neurological: Headaches: a little yesterday.    Social History   Tobacco Use  . Smoking status: Current Some Day Smoker    Types: Cigarettes   . Smokeless tobacco: Never Used  . Tobacco comment: smoked for 20-25 years, previously smoked 1 PPD. Quit in 2007  Substance Use Topics  . Alcohol use: Yes    Comment: Occasional      Objective:   BP 108/75 (BP Location: Left Arm, Patient Position: Sitting, Cuff Size: Large)   Pulse 66   Temp 98.6 F (37 C) (Oral)   Resp 16   Wt 204 lb 6.4 oz (92.7 kg)   SpO2 98%   BMI 34.01 kg/m  Vitals:   07/28/18 1424  BP: 108/75  Pulse: 66  Resp: 16  Temp: 98.6 F (37 C)  TempSrc: Oral  SpO2: 98%  Weight: 204 lb 6.4 oz (92.7 kg)     Physical Exam Constitutional:      Appearance: Normal appearance. She is normal weight.  HENT:     Right Ear: Tympanic membrane and ear canal normal.     Left Ear: Tympanic membrane and ear canal normal.     Mouth/Throat:     Mouth: Mucous membranes are moist.     Pharynx: Oropharynx is clear.  Cardiovascular:     Rate and Rhythm: Normal rate and regular rhythm.     Heart sounds: Normal heart sounds.  Pulmonary:     Effort: Pulmonary effort is normal.     Breath sounds: Wheezing present.  Neurological:     Mental Status: She is oriented to person, place, and time. Mental  status is at baseline.  Psychiatric:        Mood and Affect: Mood normal.        Behavior: Behavior normal.         Assessment & Plan    1. Bronchitis  - predniSONE (DELTASONE) 20 MG tablet; Take 1 tablet (20 mg total) by mouth daily with breakfast for 5 days.  Dispense: 5 tablet; Refill: 0 - promethazine-dextromethorphan (PROMETHAZINE-DM) 6.25-15 MG/5ML syrup; Take 5 mLs by mouth at bedtime as needed.  Dispense: 118 mL; Refill: 0  F/u PRN.     Trinna Post, PA-C  [facility]

## 2018-07-28 NOTE — Patient Instructions (Signed)
Acute Bronchitis, Adult Acute bronchitis is when air tubes (bronchi) in the lungs suddenly get swollen. The condition can make it hard to breathe. It can also cause these symptoms:  A cough.  Coughing up clear, yellow, or green mucus.  Wheezing.  Chest congestion.  Shortness of breath.  A fever.  Body aches.  Chills.  A sore throat. Follow these instructions at home:  Medicines  Take over-the-counter and prescription medicines only as told by your doctor.  If you were prescribed an antibiotic medicine, take it as told by your doctor. Do not stop taking the antibiotic even if you start to feel better. General instructions  Rest.  Drink enough fluids to keep your pee (urine) pale yellow.  Avoid smoking and secondhand smoke. If you smoke and you need help quitting, ask your doctor. Quitting will help your lungs heal faster.  Use an inhaler, cool mist vaporizer, or humidifier as told by your doctor.  Keep all follow-up visits as told by your doctor. This is important. How is this prevented? To lower your risk of getting this condition again:  Wash your hands often with soap and water. If you cannot use soap and water, use hand sanitizer.  Avoid contact with people who have cold symptoms.  Try not to touch your hands to your mouth, nose, or eyes.  Make sure to get the flu shot every year. Contact a doctor if:  Your symptoms do not get better in 2 weeks. Get help right away if:  You cough up blood.  You have chest pain.  You have very bad shortness of breath.  You become dehydrated.  You faint (pass out) or keep feeling like you are going to pass out.  You keep throwing up (vomiting).  You have a very bad headache.  Your fever or chills gets worse. This information is not intended to replace advice given to you by your health care provider. Make sure you discuss any questions you have with your health care provider. Document Released: 10/17/2007 Document  Revised: 12/12/2016 Document Reviewed: 10/19/2015 Elsevier Interactive Patient Education  2019 Elsevier Inc.  

## 2018-07-28 NOTE — Telephone Encounter (Signed)
Patient was given her health screening in person.

## 2018-07-30 ENCOUNTER — Telehealth: Payer: Self-pay | Admitting: Physician Assistant

## 2018-07-30 DIAGNOSIS — J440 Chronic obstructive pulmonary disease with acute lower respiratory infection: Principal | ICD-10-CM

## 2018-07-30 DIAGNOSIS — J209 Acute bronchitis, unspecified: Secondary | ICD-10-CM

## 2018-07-30 DIAGNOSIS — J40 Bronchitis, not specified as acute or chronic: Secondary | ICD-10-CM

## 2018-07-30 MED ORDER — BENZONATATE 100 MG PO CAPS: 100.0000 mg | ORAL_CAPSULE | Freq: Two times a day (BID) | ORAL | 0 refills | Status: AC | PRN

## 2018-07-30 NOTE — Telephone Encounter (Signed)
Patient advised as directed below. Will try the [employer]work note to return to work 03/21

## 2018-07-30 NOTE — Telephone Encounter (Signed)
She can have tessalon perles. Otherwise she can do delsym OTC. What dates does she need.

## 2018-07-30 NOTE — Telephone Encounter (Signed)
The cough she has is getting worse.  [facility] [place] [facility] not [facility] the cough medicine prescribed.  Asking if something elsecould be called in?   Also needing to discuss return to work note.  [id number] [address], [place] [phone] (Phone) [phone] (Fax)   Please advise.  Thanks, [employer]

## 2018-07-31 NOTE — Telephone Encounter (Signed)
Work note placed up front ready for pick up

## 2018-08-04 ENCOUNTER — Telehealth: Payer: Self-pay

## 2018-08-04 NOTE — Telephone Encounter (Signed)
Patient states that her work note could be faxed to [place] at [phone].

## 2018-08-04 NOTE — Telephone Encounter (Signed)
Faxed

## 2018-09-29 ENCOUNTER — Other Ambulatory Visit: Payer: Self-pay | Admitting: Physician Assistant

## 2018-09-29 DIAGNOSIS — I10 Essential (primary) hypertension: Secondary | ICD-10-CM

## 2018-09-29 NOTE — Telephone Encounter (Signed)
Please review

## 2018-10-24 ENCOUNTER — Other Ambulatory Visit: Payer: Self-pay | Admitting: Physician Assistant

## 2018-10-24 DIAGNOSIS — F39 Unspecified mood [affective] disorder: Secondary | ICD-10-CM

## 2018-10-31 ENCOUNTER — Telehealth: Payer: Self-pay | Admitting: *Deleted

## 2018-10-31 DIAGNOSIS — F39 Unspecified mood [affective] disorder: Secondary | ICD-10-CM

## 2018-10-31 MED ORDER — SERTRALINE HCL 50 MG PO TABS: 50.0000 mg | ORAL_TABLET | Freq: Every day | ORAL | 1 refills | Status: DC

## 2018-10-31 NOTE — Telephone Encounter (Signed)
Resent sertraline to [facility] [place]

## 2018-10-31 NOTE — Telephone Encounter (Signed)
Patient called stating the pharmacy advised her that they did not receive the sertraline rx. Please advise?

## 2018-11-18 ENCOUNTER — Ambulatory Visit (INDEPENDENT_AMBULATORY_CARE_PROVIDER_SITE_OTHER): Payer: BC Managed Care – PPO

## 2018-11-18 ENCOUNTER — Other Ambulatory Visit: Payer: Self-pay

## 2018-11-18 ENCOUNTER — Encounter: Payer: Self-pay | Admitting: Emergency Medicine

## 2018-11-18 ENCOUNTER — Ambulatory Visit
Admission: EM | Admit: 2018-11-18 | Discharge: 2018-11-18 | Disposition: A | Discharge status: Home / Self Care | Payer: BC Managed Care – PPO | Attending: Urgent Care | Admitting: Urgent Care

## 2018-11-18 DIAGNOSIS — S20212A Contusion of left front wall of thorax, initial encounter: Secondary | ICD-10-CM

## 2018-11-18 DIAGNOSIS — Y9319 Activity, other involving water and watercraft: Secondary | ICD-10-CM | POA: Diagnosis not present

## 2018-11-18 MED ORDER — TRAMADOL HCL 50 MG PO TABS: 50.0000 mg | ORAL_TABLET | Freq: Four times a day (QID) | ORAL | 0 refills | Status: DC | PRN

## 2018-11-18 NOTE — Discharge Instructions (Addendum)
It was very nice seeing you today in clinic. Thank you for entrusting me with your care.   Please utilize the medications that we discussed. Use ibuprofen as needed for pain. I will give you something stronger to have on hand for significant pain. Your prescriptions have been called in to your pharmacy. Make sure you are moving around and taking deep breaths. The pain can cause you to avoid these activities, which in turn can cause you to develop a pneumonia. Ice pack as tolerated to chest wall. Use a pillow when moving from a sitting to standing position to help splint pain.   Make arrangements to follow up with your regular doctor in 1 week for re-evaluation if not improving. If your symptoms/condition worsens, please seek follow up care either here or in the ER. Please remember, our [place] providers are "right here with you" when you need [place].   Again, it was my pleasure to take care of you today. Thank you for choosing our clinic. I hope that you start to feel better quickly.   Honor Loh, MSN, APRN, FNP-C, CEN Advanced Practice Provider [facility]

## 2018-11-18 NOTE — ED Triage Notes (Signed)
Pt c/o left rib pain. Started yesterday. She states that she was playing on a slip and slide 2 days ago and yesterday woke up with the rib pain.

## 2018-11-18 NOTE — ED Provider Notes (Signed)
[place], [place]   Name: Nicole Copeland DOB: 1960-09-29 MRN: [id number] CSN: [id number] PCP: Mar Daring, PA-C  Arrival date and time:  11/18/18 0865  Chief Complaint:  Rib Injury (left)   NOTE: Prior to seeing the patient today, I have reviewed the triage nursing documentation and vital signs. Clinical staff has updated patient's PMH/PSHx, current medication list, and drug allergies/intolerances to ensure comprehensive history available to assist in medical decision making.   History:   HPI: Nicole Copeland is a 58 y.o. female who presents today with complaints of pain in her LEFT ribs following an injury on Sunday (11/16/2018). Patient notes that she was playing on a "slip and slide" water slide with her grandson. She "went down hard on her belly" in order to use the slide; "I did a belly flop". Patient notes that she felt fine until yesterday when she woke up with intense pain overlying her LEFT ribs. She denies associated shortness of breath. Patient notes that pain significantly increases with movement, deep inspiration, or coughing. She is observing splint her movements. Patient has been taking ibuprofen and using ice to help with her pain, however she notes that these interventions have proven to be ineffective.   Past Medical History:  Diagnosis Date  . Anemia   . Arthritis   . Cancer ([place]) 2014   basal cell skin cancer  . Hyperlipidemia   . Hypertension   . Obesity   . Palpitations    patient states it was related to caffeine intake  . Vitamin D deficiency     Past Surgical History:  Procedure Laterality Date  . COLONOSCOPY    . COLONOSCOPY WITH PROPOFOL N/A 04/20/2016   Procedure: COLONOSCOPY WITH PROPOFOL;  Surgeon: Lucilla Lame, MD;  Location: [facility];  Service: Endoscopy;  Laterality: N/A;  . POLYPECTOMY  04/20/2016   Procedure: POLYPECTOMY;  Surgeon: Lucilla Lame, MD;  Location: [facility];  Service: Endoscopy;;  . Status post endometrial  ablation  2009 or 2010   not sure  . TUBAL LIGATION  1991    Family History  Problem Relation Age of Onset  . CAD Mother   . Congestive Heart Failure Mother   . Hypertension Mother   . Cancer Mother        colon  . CAD Father   . Diabetes Father   . Congestive Heart Failure Father   . Hypertension Maternal Grandmother   . Diabetes Paternal Grandfather   . Diabetes Sister   . Kidney disease Sister   . Celiac disease Sister   . Osteoarthritis Sister     Social History   Tobacco Use  . Smoking status: Current Some Day Smoker    Types: Cigarettes  . Smokeless tobacco: Never Used  . Tobacco comment: smoked for 20-25 years, previously smoked 1 PPD. Quit in 2007  Substance Use Topics  . Alcohol use: Yes    Comment: Occasional  . Drug use: No    Patient Active Problem List   Diagnosis Date Noted  . Hx of colonic polyps   . Benign neoplasm of transverse colon   . Avitaminosis D 03/11/2015  . Adiposity 03/11/2015  . Menopausal and postmenopausal disorder 03/11/2015  . Joint stiffness 03/11/2015  . Basal cell carcinoma 03/11/2015  . Family history of colon cancer 03/03/2015  . Acute stress disorder 03/03/2015  . Hypertension 10/19/2014  . 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 Pure hypercholesterolemia 06/15/2008  . Episodic mood disorder ([place]) 06/15/2008  . Neoplasm  of uncertain behavior of skin 10/09/2006  . Atheroma, skin 10/03/2006    Home Medications:    Current Meds  Medication Sig  . aspirin 81 MG tablet Take by mouth.  . conjugated estrogens (PREMARIN) vaginal cream Place 1 Applicatorful daily vaginally. X 1 week, then decrease to M-W-F application  . hydrochlorothiazide (HYDRODIURIL) 25 MG tablet TAKE 1 TABLET (25 MG TOTAL) BY MOUTH DAILY.  Marland Kitchen ibuprofen (MOTRIN IB) 200 MG tablet Take by mouth.  . Multiple Vitamins-Minerals (WOMENS 50+ MULTI VITAMIN/MIN) TABS Take by mouth.  . propranolol ER (INDERAL LA) 80 MG 24 hr capsule TAKE 1 CAPSULE BY MOUTH EVERY DAY  .  sertraline (ZOLOFT) 50 MG tablet Take 1 tablet (50 mg total) by mouth daily.    Allergies:   Penicillins and Sulfa antibiotics  Review of Systems (ROS): Review of Systems  Constitutional: Negative for chills and fever.  Respiratory: Negative for cough and shortness of breath.   Cardiovascular: Positive for chest pain (LEFT lateral chest wall). Negative for palpitations.  Gastrointestinal: Negative for abdominal pain, diarrhea, nausea and vomiting.  Musculoskeletal: Negative for neck pain.       Acute LEFT lateral rib pain  Skin: Negative for color change, pallor and wound.  Neurological: Negative for dizziness, weakness, numbness and headaches.     Vital Signs: Today's Vitals   11/18/18 0929 11/18/18 0930 11/18/18 0933  BP:   109/83  Pulse:   60  Resp:   18  Temp:   97.8 F (36.6 C)  TempSrc:   Oral  SpO2:   100%  Weight:  198 lb (89.8 kg)   Height:  5\' 5"  (1.651 m)   PainSc: 6       Physical Exam: Physical Exam  Constitutional: She is oriented to person, place, and time and well-developed, well-nourished, and in no distress.  HENT:  Head: Normocephalic and atraumatic.  Mouth/Throat: Mucous membranes are normal.  Eyes: Pupils are equal, round, and reactive to light. EOM are normal.  Neck: Normal range of motion. Neck supple. No tracheal deviation present.  Cardiovascular: Normal rate, regular rhythm, normal heart sounds and intact distal pulses. Exam reveals no gallop and no friction rub.  No murmur heard. Pulmonary/Chest: Effort normal and breath sounds normal. No respiratory distress. She has no wheezes. She has no rales. She exhibits tenderness (overlying LEFT ribs). She exhibits no crepitus, no deformity and no retraction.  Neurological: She is alert and oriented to person, place, and time. Gait normal. GCS score is 15.  Skin: Skin is warm and dry. No rash noted.  Psychiatric: Memory, affect and judgment normal. Her mood appears anxious.  Nursing note and vitals  reviewed.   Urgent Care Treatments / Results:   LABS: PLEASE NOTE: all labs that were ordered this encounter are listed, however only abnormal results are displayed. Labs Reviewed - No data to display  EKG: -None  RADIOLOGY: Dg Ribs Unilateral W/chest Left  Result Date: 11/18/2018 CLINICAL DATA:  Pain following recent fall EXAM: LEFT RIBS AND CHEST - 3+ VIEW COMPARISON:  Chest radiograph April 02, 2016 FINDINGS: Frontal chest as well as oblique and cone-down rib images were obtained. There is no edema or consolidation. Heart size and pulmonary vascularity are normal. No adenopathy. There is no evident pneumothorax or pleural effusion. No rib fracture. There is upper thoracic levoscoliosis. IMPRESSION: No rib fracture evident. Lungs clear. Upper thoracic levoscoliosis. No pneumothorax. Electronically Signed   By: Lowella Grip III M.D.   On: 11/18/2018 09:57  PROCEDURES: Procedures  MEDICATIONS RECEIVED THIS VISIT: Medications - No data to display  PERTINENT CLINICAL COURSE NOTES/UPDATES:   Initial Impression / Assessment and Plan / Urgent Care Course:  Pertinent labs & imaging results that were available during my care of the patient were personally reviewed by me and considered in my medical decision making (see lab/imaging section of note for values and interpretations).  Nicole Copeland is a 58 y.o. female who presents to [facility] today with complaints of Rib Injury (left)   Patient overall well appearing and in no acute distress today in clinic.  Patient presents anxious secondary to pain in her LEFT lateral chest wall following a blunt force trauma injury (slip and slide accident) sustained on Sunday (11/16/2018).  Diagnostic radiographs of her LEFT ribs and chest demonstrate no acute rib fracture or dislocation.  There was no evidence of a pneumothorax.  Symptoms consistent with rib contusion.  Patient educated on need to splint with deep breathing, coughing, and  change in position from sitting to standing.  Reviewed use of pillow to help with splinting.  Patient has been using ibuprofen with minimal effectiveness.  She is narcotic nave, therefore will proceed cautiously with pain medication.  Patient provided with a short course of tramadol to use on an as-needed basis.  Patient to contact the clinic if she finds that intervention is ineffective.  Patient encouraged to apply ice pack as tolerated to lateral chest wall as a complementary approach to managing her pain.  Current clinical condition warrants patient being out of work in order to recover from her current injury/illness. She was provided with the appropriate documentation to provide to her place of employment that will allow for her to RTW on 11/20/2018 with no restrictions.   Discussed follow up with primary care physician in 1 week for re-evaluation. I have reviewed the follow up and strict return precautions for any new or worsening symptoms. Patient is aware of symptoms that would be deemed urgent/emergent, and would thus require further evaluation either here or in the emergency department. At the time of discharge, she verbalized understanding and consent with the discharge plan as it was reviewed with her. All questions were fielded by provider and/or clinic staff prior to patient discharge.    Final Clinical Impressions / Urgent Care Diagnoses:   Final diagnoses:  Rib contusion, left, initial encounter    New Prescriptions:  [place] Controlled Substance Registry consulted? Yes, I have consulted the [place] Controlled Substances Registry for this patient, and feel the risk/benefit ratio today is favorable for proceeding with this prescription for a controlled substance.  Meds ordered this encounter  Medications  . traMADol (ULTRAM) 50 MG tablet    Sig: Take 1 tablet (50 mg total) by mouth every 6 (six) hours as needed.    Dispense:  20 tablet    Refill:  0    Recommended Follow up Care:   Patient encouraged to follow up with the following provider within the specified time frame, or sooner as dictated by the severity of her symptoms. As always, she was instructed that for any urgent/emergent care needs, she should seek care either here or in the emergency department for more immediate evaluation. Follow-up Information    Mar Daring, PA-C In 1 week.   Specialty: Family Medicine Why: General reassessment of symptoms if not improving Contact information: [place] [address] [phone]          NOTE: This note  was prepared using Lobbyist along with smaller Company secretary. Despite my best ability to proofread, there is the potential that transcriptional errors may still occur from this process, and are completely unintentional.     Karen Kitchens, NP 11/19/18 1029

## 2018-12-25 ENCOUNTER — Other Ambulatory Visit: Payer: Self-pay | Admitting: Physician Assistant

## 2018-12-25 DIAGNOSIS — I10 Essential (primary) hypertension: Secondary | ICD-10-CM

## 2018-12-28 ENCOUNTER — Other Ambulatory Visit: Payer: Self-pay | Admitting: Physician Assistant

## 2018-12-28 DIAGNOSIS — I10 Essential (primary) hypertension: Secondary | ICD-10-CM

## 2019-07-10 ENCOUNTER — Other Ambulatory Visit: Payer: Self-pay

## 2019-07-10 ENCOUNTER — Encounter: Payer: Self-pay | Admitting: Physician Assistant

## 2019-07-10 ENCOUNTER — Ambulatory Visit (INDEPENDENT_AMBULATORY_CARE_PROVIDER_SITE_OTHER): Payer: BC Managed Care – PPO | Admitting: Physician Assistant

## 2019-07-10 VITALS — BP 107/72 | HR 57 | Temp 96.6°F | Ht 65.0 in | Wt 210.0 lb

## 2019-07-10 DIAGNOSIS — Z6834 Body mass index (BMI) 34.0-34.9, adult: Secondary | ICD-10-CM

## 2019-07-10 DIAGNOSIS — Z0000 Encounter for general adult medical examination without abnormal findings: Secondary | ICD-10-CM

## 2019-07-10 DIAGNOSIS — I10 Essential (primary) hypertension: Secondary | ICD-10-CM | POA: Diagnosis not present

## 2019-07-10 DIAGNOSIS — E6609 Other obesity due to excess calories: Secondary | ICD-10-CM

## 2019-07-10 DIAGNOSIS — E7800 Pure hypercholesterolemia, unspecified: Secondary | ICD-10-CM

## 2019-07-10 DIAGNOSIS — F39 Unspecified mood [affective] disorder: Secondary | ICD-10-CM

## 2019-07-10 DIAGNOSIS — Z800 Family history of malignant neoplasm of digestive organs: Secondary | ICD-10-CM

## 2019-07-10 DIAGNOSIS — Z1231 Encounter for screening mammogram for malignant neoplasm of breast: Secondary | ICD-10-CM

## 2019-07-10 DIAGNOSIS — Z1211 Encounter for screening for malignant neoplasm of colon: Secondary | ICD-10-CM | POA: Diagnosis not present

## 2019-07-10 DIAGNOSIS — D123 Benign neoplasm of transverse colon: Secondary | ICD-10-CM

## 2019-07-10 DIAGNOSIS — E559 Vitamin D deficiency, unspecified: Secondary | ICD-10-CM

## 2019-07-10 MED ORDER — HYDROCHLOROTHIAZIDE 25 MG PO TABS: 25.0000 mg | ORAL_TABLET | Freq: Every day | ORAL | 3 refills | Status: DC

## 2019-07-10 MED ORDER — PROPRANOLOL HCL ER 80 MG PO CP24: ORAL_CAPSULE | ORAL | 3 refills | Status: DC

## 2019-07-10 MED ORDER — SERTRALINE HCL 100 MG PO TABS: 100.0000 mg | ORAL_TABLET | Freq: Every day | ORAL | 3 refills | Status: DC

## 2019-07-10 NOTE — Patient Instructions (Signed)
[facility] [address] [place],  [place]  [address] Get Driving Directions Main: [phone]   Health Maintenance for Postmenopausal Women Menopause is a normal process in which your ability to get pregnant comes to an end. This process happens slowly over many months or years, usually between the ages of 48 and 55. Menopause is complete when you have missed your menstrual periods for 12 months. It is important to talk with your health care provider about some of the most common conditions that affect women after menopause (postmenopausal women). These include heart disease, cancer, and bone loss (osteoporosis). Adopting a healthy lifestyle and getting preventive care can help to promote your health and wellness. The actions you take can also lower your chances of developing some of these common conditions. What should I know about menopause? During menopause, you may get a number of symptoms, such as:  Hot flashes. These can be moderate or severe.  Night sweats.  Decrease in sex drive.  Mood swings.  Headaches.  Tiredness.  Irritability.  Memory problems.  Insomnia. Choosing to treat or not to treat these symptoms is a decision that you make with your health care provider. Do I need hormone replacement therapy?  Hormone replacement therapy is effective in treating symptoms that are caused by menopause, such as hot flashes and night sweats.  Hormone replacement carries certain risks, especially as you become older. If you are thinking about using estrogen or estrogen with progestin, discuss the benefits and risks with your health care provider. What is my risk for heart disease and stroke? The risk of heart disease, heart attack, and stroke increases as you age. One of the causes may be a change in the body's hormones during menopause. This can affect how your body uses dietary fats, triglycerides, and cholesterol. Heart attack and stroke  are medical emergencies. There are many things that you can do to help prevent heart disease and stroke. Watch your blood pressure  High blood pressure causes heart disease and increases the risk of stroke. This is more likely to develop in people who have high blood pressure readings, are of African descent, or are overweight.  Have your blood pressure checked: ? Every 3-5 years if you are 18-39 years of age. ? Every year if you are 40 years old or older. Eat a healthy diet   Eat a diet that includes plenty of vegetables, fruits, low-fat dairy products, and lean protein.  Do not eat a lot of foods that are high in solid fats, added sugars, or sodium. Get regular exercise Get regular exercise. This is one of the most important things you can do for your health. Most adults should:  Try to exercise for at least 150 minutes each week. The exercise should increase your heart rate and make you sweat (moderate-intensity exercise).  Try to do strengthening exercises at least twice each week. Do these in addition to the moderate-intensity exercise.  Spend less time sitting. Even light physical activity can be beneficial. Other tips  Work with your health care provider to achieve or maintain a healthy weight.  Do not use any products that contain nicotine or tobacco, such as cigarettes, e-cigarettes, and chewing tobacco. If you need help quitting, ask your health care provider.  Know your numbers. Ask your health care provider to check your cholesterol and your blood sugar (glucose). Continue to have your blood tested as directed by your health care provider. Do I need screening for   cancer? Depending on your health history and family history, you may need to have cancer screening at different stages of your life. This may include screening for:  Breast cancer.  Cervical cancer.  Lung cancer.  Colorectal cancer. What is my risk for osteoporosis? After menopause, you may be at increased  risk for osteoporosis. Osteoporosis is a condition in which bone destruction happens more quickly than new bone creation. To help prevent osteoporosis or the bone fractures that can happen because of osteoporosis, you may take the following actions:  If you are 19-50 years old, get at least 1,000 mg of calcium and at least 600 mg of vitamin D per day.  If you are older than age 50 but younger than age 70, get at least 1,200 mg of calcium and at least 600 mg of vitamin D per day.  If you are older than age 70, get at least 1,200 mg of calcium and at least 800 mg of vitamin D per day. Smoking and drinking excessive alcohol increase the risk of osteoporosis. Eat foods that are rich in calcium and vitamin D, and do weight-bearing exercises several times each week as directed by your health care provider. How does menopause affect my mental health? Depression may occur at any age, but it is more common as you become older. Common symptoms of depression include:  Low or sad mood.  Changes in sleep patterns.  Changes in appetite or eating patterns.  Feeling an overall lack of motivation or enjoyment of activities that you previously enjoyed.  Frequent crying spells. Talk with your health care provider if you think that you are experiencing depression. General instructions See your health care provider for regular wellness exams and vaccines. This may include:  Scheduling regular health, dental, and eye exams.  Getting and maintaining your vaccines. These include: ? Influenza vaccine. Get this vaccine each year before the flu season begins. ? Pneumonia vaccine. ? Shingles vaccine. ? Tetanus, diphtheria, and pertussis (Tdap) booster vaccine. Your health care provider may also recommend other immunizations. Tell your health care provider if you have ever been abused or do not feel safe at home. Summary  Menopause is a normal process in which your ability to get pregnant comes to an  end.  This condition causes hot flashes, night sweats, decreased interest in sex, mood swings, headaches, or lack of sleep.  Treatment for this condition may include hormone replacement therapy.  Take actions to keep yourself healthy, including exercising regularly, eating a healthy diet, watching your weight, and checking your blood pressure and blood sugar levels.  Get screened for cancer and depression. Make sure that you are up to date with all your vaccines. This information is not intended to replace advice given to you by your health care provider. Make sure you discuss any questions you have with your health care provider. Document Revised: 04/23/2018 Document Reviewed: 04/23/2018 [employer]vier Patient Education  2020 [employer]  

## 2019-07-10 NOTE — Progress Notes (Signed)
Patient: Nicole Copeland, Female    DOB: 1960-08-04, 59 y.o.   MRN: [id number] Visit Date: 07/10/2019  Today's Provider: Mar Daring, PA-C   Chief Complaint  Patient presents with  . Annual Exam   Subjective:     Annual physical exam Nicole Copeland is a 59 y.o. female who presents today for health maintenance and complete physical. She feels well. She reports exercising. She reports she is sleeping well.  -----------------------------------------------------------------  Pt increased Sertraline 100mg  about three months ago, and seems to be helping some.   Review of Systems  Constitutional: Negative.   HENT: Negative.   Eyes: Negative.   Respiratory: Negative.   Cardiovascular: Negative.   Endocrine: Negative.   Genitourinary: Negative.   Musculoskeletal: Positive for arthralgias, back pain and joint swelling. Negative for gait problem, myalgias, neck pain and neck stiffness.  Skin: Negative.   Allergic/Immunologic: Negative.   Neurological: Negative.   Hematological: Negative.   Psychiatric/Behavioral: Negative.     Social History      She  reports that she has quit smoking. Her smoking use included cigarettes. She has never used smokeless tobacco. She reports current alcohol use. She reports that she does not use drugs.       Social History   Socioeconomic History  . Marital status: Married    Spouse name: Not on file  . Number of children: 2  . Years of education: Not on file  . Highest education level: Not on file  Occupational History  . Not on file  Tobacco Use  . Smoking status: Former Smoker    Types: Cigarettes  . Smokeless tobacco: Never Used  . Tobacco comment: smoked for 20-25 years, previously smoked 1 PPD. Quit in 2007  Substance and Sexual Activity  . Alcohol use: Yes    Comment: Occasional  . Drug use: No  . Sexual activity: Not on file  Other Topics Concern  . Not on file  Social History Narrative  . Not on file   Social  Determinants of Health   Financial Resource Strain:   . Difficulty of Paying Living Expenses: Not on file  Food Insecurity:   . Worried About Charity fundraiser in the Last Year: Not on file  . Ran Out of Food in the Last Year: Not on file  Transportation Needs:   . Lack of Transportation (Medical): Not on file  . Lack of Transportation (Non-Medical): Not on file  Physical Activity:   . Days of Exercise per Week: Not on file  . Minutes of Exercise per Session: Not on file  Stress:   . Feeling of Stress : Not on file  Social Connections:   . Frequency of Communication with Friends and Family: Not on file  . Frequency of Social Gatherings with Friends and Family: Not on file  . Attends Religious Services: Not on file  . Active Member of Clubs or Organizations: Not on file  . Attends Archivist Meetings: Not on file  . Marital Status: Not on file    Past Medical History:  Diagnosis Date  . Anemia   . Arthritis   . Cancer ([place]) 2014   basal cell skin cancer  . Hyperlipidemia   . Hypertension   . Obesity   . Palpitations    patient states it was related to caffeine intake  . Vitamin D deficiency      Patient Active Problem List   Diagnosis Date Noted  .  Hx of colonic polyps   . Benign neoplasm of transverse colon   . Avitaminosis D 03/11/2015  . Adiposity 03/11/2015  . Menopausal and postmenopausal disorder 03/11/2015  . Joint stiffness 03/11/2015  . Basal cell carcinoma 03/11/2015  . Family history of colon cancer 03/03/2015  . Acute stress disorder 03/03/2015  . Hypertension 10/19/2014  . 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 Pure hypercholesterolemia 06/15/2008  . Episodic mood disorder ([place]) 06/15/2008  . Neoplasm of uncertain behavior of skin 10/09/2006  . Atheroma, skin 10/03/2006    Past Surgical History:  Procedure Laterality Date  . COLONOSCOPY    . COLONOSCOPY WITH PROPOFOL N/A 04/20/2016   Procedure: COLONOSCOPY WITH PROPOFOL;  Surgeon: Lucilla Lame, MD;  Location: [facility];  Service: Endoscopy;  Laterality: N/A;  . POLYPECTOMY  04/20/2016   Procedure: POLYPECTOMY;  Surgeon: Lucilla Lame, MD;  Location: [facility];  Service: Endoscopy;;  . Status post endometrial ablation  2009 or 2010   not sure  . TUBAL LIGATION  1991    Family History        Family Status  Relation Name Status  . Mother  Deceased at age 67       Colon Cancer, chronic lung disease  . Father  Deceased  . MGM  Deceased       died from a stroke  . MGF  Deceased       died from cancer. Unknown Type  . PGM  Deceased       died in a house fire  . PGF  Deceased       died from congestive heart failure  . Sister  Deceased at age 39       kidney   . Daughter  Alive  . Son  Alive  . Sister  Alive  . Sister  Alive  . Daughter  Alive  . Daughter  Alive        Her family history includes CAD in her father and mother; Cancer in her mother; Celiac disease in her sister; Congestive Heart Failure in her father and mother; Diabetes in her father, paternal grandfather, and sister; Hypertension in her maternal grandmother and mother; Kidney disease in her sister; Osteoarthritis in her sister.      Allergies  Allergen Reactions  . Penicillins   . Sulfa Antibiotics Rash     Current Outpatient Medications:  .  aspirin 81 MG tablet, Take by mouth., Disp: , Rfl:  .  hydrochlorothiazide (HYDRODIURIL) 25 MG tablet, TAKE 1 TABLET BY MOUTH EVERY DAY, Disp: 90 tablet, Rfl: 1 .  ibuprofen (MOTRIN IB) 200 MG tablet, Take by mouth., Disp: , Rfl:  .  Multiple Vitamins-Minerals (WOMENS 50+ MULTI VITAMIN/MIN) TABS, Take by mouth., Disp: , Rfl:  .  propranolol ER (INDERAL LA) 80 MG 24 hr capsule, TAKE 1 CAPSULE BY MOUTH EVERY DAY, Disp: 90 capsule, Rfl: 1 .  sertraline (ZOLOFT) 50 MG tablet, Take 1 tablet (50 mg total) by mouth daily. (Patient taking differently: Take 100 mg by mouth daily. ), Disp: 90 tablet, Rfl: 1 .  conjugated estrogens (PREMARIN)  vaginal cream, Place 1 Applicatorful daily vaginally. X 1 week, then decrease to M-W-F application (Patient not taking: Reported on 07/10/2019), Disp: 42.5 g, Rfl: 3 .  promethazine-dextromethorphan (PROMETHAZINE-DM) 6.25-15 MG/5ML syrup, Take 5 mLs by mouth at bedtime as needed., Disp: 118 mL, Rfl: 0 .  traMADol (ULTRAM) 50 MG tablet, Take 1 tablet (50 mg total) by mouth every 6 (six) hours  as needed. (Patient not taking: Reported on 07/10/2019), Disp: 20 tablet, Rfl: 0   Patient Care Team: Mar Daring, PA-C as PCP - General (Family Medicine)    Objective:    Vitals: BP 107/72 (BP Location: Right Arm, Patient Position: Sitting, Cuff Size: Large)   Pulse (!) 57   Temp (!) 96.6 F (35.9 C) (Temporal)   Ht 5\' 5"  (1.651 m)   Wt 210 lb (95.3 kg)   BMI 34.95 kg/m    Vitals:   07/10/19 0914  BP: 107/72  Pulse: (!) 57  Temp: (!) 96.6 F (35.9 C)  TempSrc: Temporal  Weight: 210 lb (95.3 kg)  Height: 5\' 5"  (1.651 m)     Physical Exam Vitals reviewed.  Constitutional:      General: She is not in acute distress.    Appearance: Normal appearance. She is well-developed. She is obese. She is not ill-appearing or diaphoretic.  HENT:     Head: Normocephalic and atraumatic.     Right Ear: Tympanic membrane, ear canal and external ear normal.     Left Ear: Tympanic membrane, ear canal and external ear normal.  Eyes:     General: No scleral icterus.       Right eye: No discharge.        Left eye: No discharge.     Extraocular Movements: Extraocular movements intact.     Conjunctiva/sclera: Conjunctivae normal.     Pupils: Pupils are equal, round, and reactive to light.  Neck:     Thyroid: No thyromegaly.     Vascular: No carotid bruit or JVD.     Trachea: No tracheal deviation.  Cardiovascular:     Rate and Rhythm: Normal rate and regular rhythm.     Pulses: Normal pulses.     Heart sounds: Normal heart sounds. No murmur. No friction rub. No gallop.   Pulmonary:     Effort:  Pulmonary effort is normal. No respiratory distress.     Breath sounds: Normal breath sounds. No wheezing or rales.  Chest:     Chest wall: No tenderness.  Abdominal:     General: Abdomen is flat. Bowel sounds are normal. There is no distension.     Palpations: Abdomen is soft. There is no mass.     Tenderness: There is no abdominal tenderness. There is no guarding or rebound.  Musculoskeletal:        General: No tenderness. Normal range of motion.     Cervical back: Normal range of motion and neck supple.     Right lower leg: No edema.     Left lower leg: No edema.  Lymphadenopathy:     Cervical: No cervical adenopathy.  Skin:    General: Skin is warm and dry.     Capillary Refill: Capillary refill takes less than 2 seconds.     Findings: No rash.  Neurological:     General: No focal deficit present.     Mental Status: She is alert and oriented to person, place, and time. Mental status is at baseline.  Psychiatric:        Mood and Affect: Mood normal.        Behavior: Behavior normal.        Thought Content: Thought content normal.        Judgment: Judgment normal.      Depression Screen PHQ 2/9 Scores 07/11/2018 03/22/2017 03/20/2016  PHQ - 2 Score 0 0 0  PHQ- 9 Score 0 - -  Assessment & Plan:     Routine Health Maintenance and Physical Exam  Exercise Activities and Dietary recommendations Goals   None     Immunization History  Administered Date(s) Administered  . Influenza,inj,Quad PF,6+ Mos 03/11/2015  . Moderna SARS-COVID-2 Vaccination 06/24/2019  . Tdap 03/23/2011    Health Maintenance  Topic Date Due  . MAMMOGRAM  05/01/2018  . COLONOSCOPY  04/21/2019  . INFLUENZA VACCINE  08/12/2019 (Originally 12/13/2018)  . TETANUS/TDAP  03/22/2021  . PAP SMEAR-Modifier  07/11/2021  . Hepatitis C Screening  Completed  . HIV Screening  Completed     Discussed health benefits of physical activity, and encouraged her to engage in regular exercise appropriate  for her age and condition.    1. Annual physical exam Normal physical exam today. Will check labs as below and f/u pending lab results. If labs are stable and WNL she will not need to have these rechecked for one year at her next annual physical exam. She is to call the office in the meantime if she has any acute issue, questions or concerns. - CBC w/Diff/Platelet - Comprehensive Metabolic Panel (CMET) - TSH - HgB A1c  2. Breast cancer screening by mammogram Breast exam today was normal. There is no family history of breast cancer. She does perform regular self breast exams. Mammogram was ordered as below. Information for [facility] Breast clinic was given to patient so she may schedule her mammogram at her convenience. - MM Digital Screening Bilateral  3. Colon cancer screening Due for f/u colonoscopy. - Ambulatory referral to Gastroenterology  4. Essential hypertension Stable. Diagnosis pulled for medication refill. Continue current medical treatment plan. Will check labs as below and f/u pending results. - CBC w/Diff/Platelet - Comprehensive Metabolic Panel (CMET) - TSH - HgB A1c - propranolol ER (INDERAL LA) 80 MG 24 hr capsule; TAKE 1 CAPSULE BY MOUTH EVERY DAY  Dispense: 90 capsule; Refill: 3 - hydrochlorothiazide (HYDRODIURIL) 25 MG tablet; Take 1 tablet (25 mg total) by mouth daily.  Dispense: 90 tablet; Refill: 3  5. Benign neoplasm of transverse colon Referral placed for colonoscopy.  - Ambulatory referral to Gastroenterology  6. Family history of colon cancer Mother - Ambulatory referral to Gastroenterology  7. Pure hypercholesterolemia Diet controlled. Will check labs as below and f/u pending results. - CBC w/Diff/Platelet - Comprehensive Metabolic Panel (CMET) - TSH - HgB A1c  8. Avitaminosis D Postmenopausal, history of Vit D def. Will check labs as below and f/u pending results. - CBC w/Diff/Platelet - Vitamin D (25 hydroxy)  9. Class 1 obesity due to excess  calories with serious comorbidity and body mass index (BMI) of 34.0 to 34.9 in adult Counseled patient on healthy lifestyle modifications including dieting and exercise.  - Comprehensive Metabolic Panel (CMET) - Lipid Panel With LDL/HDL Ratio - HgB A1c  10. Episodic mood disorder (HCC) Continue 100mg  Sertraline.  - sertraline (ZOLOFT) 100 MG tablet; Take 1 tablet (100 mg total) by mouth daily.  Dispense: 90 tablet; Refill: 3  --------------------------------------------------------------------    Mar Daring, PA-C  [facility]

## 2019-07-11 LAB — COMPREHENSIVE METABOLIC PANEL
ALT: 19 IU/L (ref 0–32)
AST: 23 IU/L (ref 0–40)
Albumin/Globulin Ratio: 2 (ref 1.2–2.2)
Albumin: 4.7 g/dL (ref 3.8–4.9)
Alkaline Phosphatase: 76 IU/L (ref 39–117)
BUN/Creatinine Ratio: 22 (ref 9–23)
BUN: 18 mg/dL (ref 6–24)
Bilirubin Total: 0.3 mg/dL (ref 0.0–1.2)
CO2: 21 mmol/L (ref 20–29)
Calcium: 9.4 mg/dL (ref 8.7–10.2)
Chloride: 104 mmol/L (ref 96–106)
Creatinine, Ser: 0.81 mg/dL (ref 0.57–1.00)
GFR calc Af Amer: 93 mL/min/{1.73_m2} (ref >59)
GFR calc non Af Amer: 81 mL/min/{1.73_m2} (ref >59)
Globulin, Total: 2.3 g/dL (ref 1.5–4.5)
Glucose: 78 mg/dL (ref 65–99)
Potassium: 4.4 mmol/L (ref 3.5–5.2)
Sodium: 141 mmol/L (ref 134–144)
Total Protein: 7 g/dL (ref 6.0–8.5)

## 2019-07-11 LAB — TSH: TSH: 1.84 u[IU]/mL (ref 0.450–4.500)

## 2019-07-11 LAB — CBC WITH DIFFERENTIAL/PLATELET
Basophils Absolute: 0.1 10*3/uL (ref 0.0–0.2)
Basos: 1 %
EOS (ABSOLUTE): 0.3 10*3/uL (ref 0.0–0.4)
Eos: 4 %
Hematocrit: 40.3 % (ref 34.0–46.6)
Hemoglobin: 13 g/dL (ref 11.1–15.9)
Immature Grans (Abs): 0 10*3/uL (ref 0.0–0.1)
Immature Granulocytes: 0 %
Lymphocytes Absolute: 1.8 10*3/uL (ref 0.7–3.1)
Lymphs: 27 %
MCH: 26.9 pg (ref 26.6–33.0)
MCHC: 32.3 g/dL (ref 31.5–35.7)
MCV: 83 fL (ref 79–97)
Monocytes Absolute: 0.5 10*3/uL (ref 0.1–0.9)
Monocytes: 8 %
Neutrophils Absolute: 3.9 10*3/uL (ref 1.4–7.0)
Neutrophils: 60 %
Platelets: 311 10*3/uL (ref 150–450)
RBC: 4.84 x10E6/uL (ref 3.77–5.28)
RDW: 13.8 % (ref 11.7–15.4)
WBC: 6.6 10*3/uL (ref 3.4–10.8)

## 2019-07-11 LAB — LIPID PANEL WITH LDL/HDL RATIO
Cholesterol, Total: 209 mg/dL — ABNORMAL HIGH (ref 100–199)
HDL: 51 mg/dL (ref >39)
LDL Chol Calc (NIH): 133 mg/dL — ABNORMAL HIGH (ref 0–99)
LDL/HDL Ratio: 2.6 ratio (ref 0.0–3.2)
Triglycerides: 142 mg/dL (ref 0–149)
VLDL Cholesterol Cal: 25 mg/dL (ref 5–40)

## 2019-07-11 LAB — VITAMIN D 25 HYDROXY (VIT D DEFICIENCY, FRACTURES): Vit D, 25-Hydroxy: 32.4 ng/mL (ref 30.0–100.0)

## 2019-07-11 LAB — HEMOGLOBIN A1C
Est. average glucose Bld gHb Est-mCnc: 103 mg/dL
Hgb A1c MFr Bld: 5.2 % (ref 4.8–5.6)

## 2019-07-13 ENCOUNTER — Telehealth: Payer: Self-pay | Admitting: Gastroenterology

## 2019-07-13 ENCOUNTER — Telehealth: Payer: Self-pay

## 2019-07-13 ENCOUNTER — Other Ambulatory Visit: Payer: Self-pay

## 2019-07-13 DIAGNOSIS — Z800 Family history of malignant neoplasm of digestive organs: Secondary | ICD-10-CM

## 2019-07-13 DIAGNOSIS — D123 Benign neoplasm of transverse colon: Secondary | ICD-10-CM

## 2019-07-13 DIAGNOSIS — Z1211 Encounter for screening for malignant neoplasm of colon: Secondary | ICD-10-CM

## 2019-07-13 NOTE — Telephone Encounter (Signed)
Pt left vm returning a call to schedule a colonoscopy

## 2019-07-13 NOTE — Telephone Encounter (Signed)
Patient was advised and states that she will started taking the VitD 1000 IU daily. Patient wanted to advised Tawanna Sat that she faxed a form she needs completed for work from her physical.

## 2019-07-13 NOTE — Telephone Encounter (Signed)
Gastroenterology Pre-Procedure Review  Request Date: Friday 08/07/19 Requesting Physician: Dr. Allen Norris  PATIENT REVIEW QUESTIONS: The patient responded to the following health history questions as indicated:    1. Are you having any GI issues? no 2. Do you have a personal history of Polyps? yes (2017 with Dr. Allen Norris) 3. Do you have a family history of Colon Cancer or Polyps? yes (mother colon cancer) 4. Diabetes Mellitus? no 5. Joint replacements in the past 12 months?no 6. Major health problems in the past 3 months?no 7. Any artificial heart valves, MVP, or defibrillator?no    MEDICATIONS & ALLERGIES:    Patient reports the following regarding taking any anticoagulation/antiplatelet therapy:   Plavix, Coumadin, Eliquis, Xarelto, Lovenox, Pradaxa, Brilinta, or Effient? no Aspirin? Yes 81 mg daily  Patient confirms/reports the following medications:  Current Outpatient Medications  Medication Sig Dispense Refill  . aspirin 81 MG tablet Take by mouth.    . hydrochlorothiazide (HYDRODIURIL) 25 MG tablet Take 1 tablet (25 mg total) by mouth daily. 90 tablet 3  . ibuprofen (MOTRIN IB) 200 MG tablet Take by mouth.    . Multiple Vitamins-Minerals (WOMENS 50+ MULTI VITAMIN/MIN) TABS Take by mouth.    . propranolol ER (INDERAL LA) 80 MG 24 hr capsule TAKE 1 CAPSULE BY MOUTH EVERY DAY 90 capsule 3  . sertraline (ZOLOFT) 100 MG tablet Take 1 tablet (100 mg total) by mouth daily. 90 tablet 3  . traMADol (ULTRAM) 50 MG tablet Take 1 tablet (50 mg total) by mouth every 6 (six) hours as needed. (Patient not taking: Reported on 07/10/2019) 20 tablet 0   No current facility-administered medications for this visit.    Patient confirms/reports the following allergies:  Allergies  Allergen Reactions  . Penicillins   . Sulfa Antibiotics Rash    No orders of the defined types were placed in this encounter.   AUTHORIZATION INFORMATION Primary Insurance: 1D#: Group #:  Secondary  Insurance: 1D#: Group #:  SCHEDULE INFORMATION: Date: Friday 08/07/19 Time: Location:[facility]

## 2019-07-13 NOTE — Telephone Encounter (Signed)
-----   Message from Mar Daring, [place] sent at 07/13/2019  8:14 AM EST ----- Blood count is normal. Kidney and liver function are normal. Sodium, potassium and calcium are normal. Thyroid is normal. Cholesterol remains borderline high but is stable compared to last year. A1c/sugar is normal. Vit D is low normal. Recommend OTC Vit D 1000 IU daily if not already taking.

## 2019-07-15 ENCOUNTER — Telehealth: Payer: Self-pay

## 2019-07-15 NOTE — Telephone Encounter (Signed)
Patients employer will be giving her COVID test on 08/04/19 and she will fax me or email me the results as soon as she gets them back for her 08/07/19 colonoscopy.  I will send to [employer] in [place] after she sends them.  Thanks,  [place], [place]

## 2019-07-22 ENCOUNTER — Telehealth: Payer: Self-pay

## 2019-07-22 DIAGNOSIS — L723 Sebaceous cyst: Secondary | ICD-10-CM

## 2019-07-22 NOTE — Telephone Encounter (Signed)
Copied from [place] [phone] [id number]. Topic: General - Inquiry >> Jul 22, 2019  2:21 PM Richardo Priest, [place] wrote: Reason for CRM: Patient called in stating she would like her last appointment notes and information, 07/10/19, faxed over to [phone], attention Felicia. Patient is also calling to check on status of PCP or team setting up the appointment for cyst removal. Please advise

## 2019-07-23 NOTE — Telephone Encounter (Signed)
She said that she prefers Gen Surg

## 2019-07-23 NOTE — Telephone Encounter (Signed)
I have printed the note from 02/26 but I'm not sure about the cyst?

## 2019-07-23 NOTE — Telephone Encounter (Signed)
I remember [place] talking about it and I know I had discussed general surgery vs dermatology for removal. She was to let me know which she would prefer. Gen surg will get her in quicker, but dermatology may offer better success.

## 2019-07-23 NOTE — Addendum Note (Signed)
Addended by: Mar Daring on: 07/23/2019 01:26 PM   Modules accepted: Orders

## 2019-07-28 ENCOUNTER — Other Ambulatory Visit: Payer: Self-pay

## 2019-07-30 ENCOUNTER — Ambulatory Visit: Payer: Self-pay | Admitting: Surgery

## 2019-08-21 ENCOUNTER — Other Ambulatory Visit: Payer: Self-pay

## 2019-08-21 ENCOUNTER — Ambulatory Visit: Payer: BC Managed Care – PPO | Admitting: Surgery

## 2019-08-21 ENCOUNTER — Encounter: Payer: Self-pay | Admitting: Surgery

## 2019-08-21 VITALS — BP 125/80 | HR 59 | Temp 97.9°F | Resp 12 | Ht 65.0 in | Wt 210.4 lb

## 2019-08-21 DIAGNOSIS — L723 Sebaceous cyst: Secondary | ICD-10-CM

## 2019-08-21 NOTE — Progress Notes (Signed)
08/21/2019  Reason for Visit:  Sebaceous cyst of the upper back  Referring Provider:  Fenton Malling, PA-C  History of Present Illness: Nicole Copeland is a 59 y.o. female presenting for evaluation and treatment of a sebaceous cyst.  The patient has a history of two sebaceous cysts excisions with Dr. Pat Patrick about 5-6 years ago.  She reports that the cyst on the upper back was more difficult to remove and he told the patient that it could come back in the future.  She has noticed the cyst over the past three years, slowly growing in size.  There is a blackhead at the opening of the pore to the cyst.  It does not particularly bother her and denies any pain or redness or drainage of the area.  Her main concern is that she does not like how it looks and is worried about people seeing it on her back if they're behind her.  She would like it removed.    Past Medical History: Past Medical History:  Diagnosis Date  . Anemia   . Arthritis   . Cancer ([place]) 2014   basal cell skin cancer  . Hyperlipidemia   . Hypertension   . Obesity   . Palpitations    patient states it was related to caffeine intake  . Vitamin D deficiency      Past Surgical History: Past Surgical History:  Procedure Laterality Date  . COLONOSCOPY    . COLONOSCOPY WITH PROPOFOL N/A 04/20/2016   Procedure: COLONOSCOPY WITH PROPOFOL;  Surgeon: Lucilla Lame, MD;  Location: [facility];  Service: Endoscopy;  Laterality: N/A;  . POLYPECTOMY  04/20/2016   Procedure: POLYPECTOMY;  Surgeon: Lucilla Lame, MD;  Location: [facility];  Service: Endoscopy;;  . Status post endometrial ablation  2009 or 2010   not sure  . TUBAL LIGATION  1991    Home Medications: Prior to Admission medications   Medication Sig Start Date End Date Taking? Authorizing Provider  aspirin 81 MG tablet Take by mouth.   Yes [provider]  hydrochlorothiazide (HYDRODIURIL) 25 MG tablet Take 1 tablet (25 mg total) by mouth daily.  07/10/19  Yes Mar Daring, PA-C  ibuprofen (MOTRIN IB) 200 MG tablet Take by mouth.   Yes [provider]  Multiple Vitamins-Minerals (WOMENS 50+ MULTI VITAMIN/MIN) TABS Take by mouth.   Yes [provider]  propranolol ER (INDERAL LA) 80 MG 24 hr capsule TAKE 1 CAPSULE BY MOUTH EVERY DAY 07/10/19  Yes Fenton Malling M, PA-C  sertraline (ZOLOFT) 100 MG tablet Take 1 tablet (100 mg total) by mouth daily. 07/10/19  Yes Mar Daring, PA-C    Allergies: Allergies  Allergen Reactions  . Penicillins   . Sulfa Antibiotics Rash    Social History:  reports that she has quit smoking. Her smoking use included cigarettes. She has never used smokeless tobacco. She reports current alcohol use. She reports that she does not use drugs.   Family History: Family History  Problem Relation Age of Onset  . CAD Mother   . Congestive Heart Failure Mother   . Hypertension Mother   . Cancer Mother        colon  . CAD Father   . Diabetes Father   . Congestive Heart Failure Father   . Hypertension Maternal Grandmother   . Diabetes Paternal Grandfather   . Diabetes Sister   . Kidney disease Sister   . Celiac disease Sister   . Osteoarthritis Sister  Review of Systems: Review of Systems  Constitutional: Negative for chills and fever.  HENT: Negative for hearing loss.   Respiratory: Negative for shortness of breath.   Cardiovascular: Negative for chest pain.  Gastrointestinal: Negative for abdominal pain, nausea and vomiting.  Genitourinary: Negative for dysuria.  Musculoskeletal: Negative for myalgias.  Skin:       Sebaceous cyst of upper back with blackhead  Neurological: Negative for dizziness.  Psychiatric/Behavioral: Negative for depression.    Physical Exam BP 125/80   Pulse (!) 59   Temp 97.9 F (36.6 C)   Resp 12   Ht 5\' 5"  (1.651 m)   Wt 210 lb 6.4 oz (95.4 kg)   SpO2 95%   BMI 35.01 kg/m  CONSTITUTIONAL: No acute distress HEENT:   Normocephalic, atraumatic, extraocular motion intact. NECK: Trachea is midline, and there is no jugular venous distension.  RESPIRATORY:  Lungs are clear, and breath sounds are equal bilaterally. Normal respiratory effort without pathologic use of accessory muscles. CARDIOVASCULAR: Heart is regular without murmurs, gallops, or rubs. GI: The abdomen is soft, non-distended, non-tender.  MUSCULOSKELETAL:  Normal muscle strength and tone in all four extremities.  No peripheral edema or cyanosis. SKIN: The upper back, at the midline level, has a prior scar measuring about 2.5 - 3 cm in length from prior cyst excision.  Deep to the scar, the patient has a new cyst that has grown, measuring about 1 cm.  There is a small blackhead at the cyst pore that is at the inferior portion of the cyst, about 5 mm from the scar.  No erythema, drainage. NEUROLOGIC:  Motor and sensation is grossly normal.  Cranial nerves are grossly intact. PSYCH:  Alert and oriented to person, place and time. Affect is normal.  Laboratory Analysis: No results found for this or any previous visit (from the past 24 hour(s)).  Imaging: No results found.  Assessment and Plan: This is a 59 y.o. 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  --Discussed with the patient that this is a recurrent cyst and we can excise it as well.  Given that the blackhead pore is inferior to the scar, I think it may be best to do a new incision to be able to incorporate the blackhead to decrease the risk of it coming back again.  Discussed the procedure planned for cyst excision, with risks of bleeding, infection, and injury to surrounding structures.  We can do this as an office procedure under local anesthesia.  I will send this to be analyzed by pathology as a precaution.  Patient understands this plan we will schedule her for 09/11/2019.  Face-to-face time spent with the patient and care providers was 40 minutes, with more than 50% of the  time spent counseling, educating, and coordinating care of the patient.     Melvyn Neth, [facility]

## 2019-08-21 NOTE — Patient Instructions (Addendum)
We will make an appointment for you to have this cyst removed here at the office as an "Office Procedure".  Your last dose of Aspirin should be on Monday 09/07/19 and if everything goes well you may resume your Aspirin that Sunday 09/13/19.  Please see your appointment below. Call the office if you have any questions or concerns.   Epidermal Cyst Removal  Epidermal cyst removal is a procedure to remove a sac of oily material (keratin) that has formed under your skin (epidermal cyst). Epidermal cysts may also be called epidermoid cysts, sebaceous cysts, or keratin cysts. Normally, the skin secretes this oily material through a gland or a hair follicle. However, when a skin gland or hair follicle becomes blocked, an epidermal cyst can form. You may need this procedure if you have an epidermal cyst that becomes large, uncomfortable, or infected. Tell a health care provider about:  Any allergies you have.  All medicines you are taking, including vitamins, herbs, eye drops, creams, and over-the-counter medicines.  Any problems you or family members have had with anesthetic medicines.  Any blood disorders you have.  Any surgeries you have had.  Any medical conditions you have now or have had.  Whether you are pregnant or may be pregnant. What are the risks? Generally, this is a safe procedure. However, problems may occur, including:  Development of another cyst.  Bleeding.  Infection.  Scarring. What happens before the procedure?  Ask your health care provider about: ? Changing or stopping your regular medicines. This is especially important if you are taking diabetes medicines or blood thinners. ? Taking medicines such as aspirin and ibuprofen. These medicines can thin your blood. Do not take these medicines unless your health care provider tells you to take them. ? Taking over-the-counter medicines, vitamins, herbs, and supplements.  If you have an infected cyst, you may have to  take antibiotic medicine before the cyst removal. Take your antibiotic as told by your health care provider. Do not stop taking the antibiotic even if you start to feel better.  Take a shower on the morning of your procedure. Your health care provider may ask you to use a germ-killing soap. What happens during the procedure?   You will be given a medicine to numb the area (local anesthetic).  The skin around the cyst will be cleaned with a germ-killing solution.  The health care provider will make a small incision in your skin over the cyst.  The health care provider will separate the cyst from the surrounding tissues that are under your skin.  If possible, the cyst will be removed undamaged (intact).  If the cyst bursts (ruptures), it will be removed in pieces.  After the cyst is removed, the health care provider will control any bleeding and close the incision with small stitches (sutures). Small incisions may not need sutures, and the bleeding will be controlled by applying direct pressure with gauze.  The health care provider may apply antibiotic ointment and a bandage (dressing) over the incision. The procedure may vary among health care providers and hospitals. What happens after the procedure?  If your cyst ruptured during the procedure, you may need an antibiotic. If you are prescribed an antibiotic medicine or ointment, take or apply it as told by your health care provider. Do not stop using the antibiotic even if you start to feel better. Summary  Epidermal cyst removal is a procedure to remove a sac of oily material (keratin) that has formed under  your skin (epidermal cyst).  You may need this procedure if you have an epidermal cyst that becomes large, uncomfortable, or infected.  The health care provider will make a small incision in your skin to remove the cyst.  If you are prescribed an antibiotic medicine before the procedure, after the procedure, or both, use the  antibiotic as told by your health care provider. Do not stop using the antibiotic even if you start to feel better. This information is not intended to replace advice given to you by your health care provider. Make sure you discuss any questions you have with your health care provider. Document Revised: 08/21/2018 Document Reviewed: 02/21/2017 Elsevier Patient Education  [place].

## 2019-08-25 ENCOUNTER — Telehealth: Payer: Self-pay

## 2019-08-25 NOTE — Telephone Encounter (Signed)
Patient has a family emergency. Would like to cancel procedure.

## 2019-08-25 NOTE — Telephone Encounter (Signed)
Pt's procedure has been rescheduled.

## 2019-08-26 ENCOUNTER — Other Ambulatory Visit: Admission: RE | Admit: 2019-08-26 | Payer: BC Managed Care – PPO | Source: Ambulatory Visit

## 2019-09-11 ENCOUNTER — Other Ambulatory Visit: Payer: Self-pay | Admitting: Surgery

## 2019-09-11 ENCOUNTER — Encounter: Payer: Self-pay | Admitting: Surgery

## 2019-09-11 ENCOUNTER — Other Ambulatory Visit: Payer: Self-pay

## 2019-09-11 ENCOUNTER — Ambulatory Visit: Payer: BC Managed Care – PPO | Admitting: Surgery

## 2019-09-11 VITALS — BP 119/80 | HR 66 | Temp 97.5°F | Ht 65.0 in | Wt 211.0 lb

## 2019-09-11 DIAGNOSIS — L918 Other hypertrophic disorders of the skin: Secondary | ICD-10-CM

## 2019-09-11 DIAGNOSIS — L723 Sebaceous cyst: Secondary | ICD-10-CM

## 2019-09-11 NOTE — Patient Instructions (Addendum)
We have removed a Cyst and skin tag in our office today.  You have sutures under the skin that will dissolve and also dermabond (skin glue) on top of your skin which will come off on it's own in 10-14 days.  You may shower in 48 hours, this is on 09/13/19. Do not submerge the area for about 2 weeks until it is fully healed.   Avoid Strenuous activities that will make you sweat during the next 48 hours to avoid the glue coming off prematurely. Avoid activities that will place pressure to this area of the body for 1-2 weeks to avoid re-injury to incision site.  You may use Tylenol or Ibuprofen for any pain control. Use the ice pack provided several times today and tomorrow as needed for comfort and to prevent bleeding.   Please see your follow-up appointment provided. We will see you back in office to make sure this area is healed and to review the final pathology. If you have any questions or concerns prior to this appointment, call our office and speak with a nurse.    Excision of Skin Cysts or Lesions Excision of a skin lesion refers to the removal of a section of skin by making small cuts (incisions) in the skin. This procedure may be done to remove a cancerous (malignant) or noncancerous (benign) growth on the skin. It is typically done to treat or prevent cancer or infection. It may also be done to improve cosmetic appearance. The procedure may be done to remove:  Cancerous growths, such as basal cell carcinoma, squamous cell carcinoma, or melanoma.  Noncancerous growths, such as a cyst or lipoma.  Growths, such as moles or skin tags, which may be removed for cosmetic reasons.  Various excision or surgical techniques may be used depending on your condition, the location of the lesion, and your overall health. Tell a health care provider about:  Any allergies you have.  All medicines you are taking, including vitamins, herbs, eye drops, creams, and over-the-counter medicines.  Any  problems you or family members have had with anesthetic medicines.  Any blood disorders you have.  Any surgeries you have had.  Any medical conditions you have.  Whether you are pregnant or may be pregnant. What are the risks? Generally, this is a safe procedure. However, problems may occur, including:  Bleeding.  Infection.  Scarring.  Recurrence of the cyst, lipoma, or cancer.  Changes in skin sensation or appearance, such as discoloration or swelling.  Reaction to the anesthetics.  Allergic reaction to surgical materials or ointments.  Damage to nerves, blood vessels, muscles, or other structures.  Continued pain.  What happens before the procedure?  Ask your health care provider about: ? Changing or stopping your regular medicines. This is especially important if you are taking diabetes medicines or blood thinners. ? Taking medicines such as aspirin and ibuprofen. These medicines can thin your blood. Do not take these medicines before your procedure if your health care provider instructs you not to.  You may be asked to take certain medicines.  You may be asked to stop smoking.  You may have an exam or testing.  Plan to have someone take you home after the procedure.  Plan to have someone help you with activities during recovery. What happens during the procedure?  To reduce your risk of infection: ? Your health care team will wash or sanitize their hands. ? Your skin will be washed with soap.  You will be given  a medicine to numb the area (local anesthetic).  One of the following excision techniques will be performed.  At the end of any of these procedures, antibiotic ointment will be applied as needed. Each of the following techniques may vary among health care providers and hospitals. Complete Surgical Excision The area of skin that needs to be removed will be marked with a pen. Using a small scalpel or scissors, the surgeon will gently cut around and  under the lesion until it is completely removed. The lesion will be placed in a fluid and sent to the lab for examination. If necessary, bleeding will be controlled with a device that delivers heat (electrocautery). The edges of the wound may be stitched (sutured) together, and a bandage (dressing) will be applied. This procedure may be performed to treat a cancerous growth or a noncancerous cyst or lesion. Excision of a Cyst The surgeon will make an incision on the cyst. The entire cyst will be removed through the incision. The incision may be closed with sutures. Shave Excision During shave excision, the surgeon will use a small blade or an electrically heated loop instrument to shave off the lesion. This may be done to remove a mole or a skin tag. The wound will usually be left to heal on its own without sutures. Punch Excision During punch excision, the surgeon will use a small tool that is like a cookie cutter or a hole punch to cut a circle shape out of the skin. The outer edges of the skin will be sutured together. This may be done to remove a mole or a scar or to perform a biopsy of the lesion. Mohs Micrographic Surgery During Mohs micrographic surgery, layers of the lesion will be removed with a scalpel or a loop instrument and will be examined right away under a microscope. Layers will be removed until all of the abnormal or cancerous tissue has been removed. This procedure is minimally invasive, and it ensures the best cosmetic outcome. It involves the removal of as little normal tissue as possible. Mohs is usually done to treat skin cancer, such as basal cell carcinoma or squamous cell carcinoma, particularly on the face and ears. Depending on the size of the surgical wound, it may be sutured closed. What happens after the procedure?  Return to your normal activities as told by your health care provider.  Talk with your health care provider to discuss any test results, treatment options, and  if necessary, the need for more tests. This information is not intended to replace advice given to you by your health care provider. Make sure you discuss any questions you have with your health care provider. Document Released: 07/25/2009 Document Revised: 10/06/2015 Document Reviewed: 06/16/2014 Elsevier Interactive Patient Education  [employer].

## 2019-09-11 NOTE — Progress Notes (Signed)
  Procedure Date:  09/11/2019  Pre-operative Diagnosis:  Upper back sebaceous cyst and right neck skin tag  Post-operative Diagnosis:  Upper back sebaceous cyst and right neck skin tag  Procedure:  Excision of upper back sebaceous cyst and right neck skin tag  Surgeon:  Melvyn Neth, MD  Anesthesia:  6 ml 1% lidocaine with epi  Estimated Blood Loss:  3 ml  Specimens:  Sebaceous cyst  Complications:  None  Indications for Procedure:  This is a 59 y.o. female with diagnosis of a upper back sebaceous cyst as well as right neck skin tag.  The patient wishes to have these excised. The risks of bleeding, abscess or infection, injury to surrounding structures, and need for further procedures were all discussed with the patient and she was willing to proceed.  Description of Procedure: The patient was correctly identified at bedside.  Appropriate time-outs were performed.  The patient's upper back was prepped and draped in usual sterile fashion.  Local anesthetic was infused intradermally.  A 3 cm incision was made over the cyst, and scalpel was used to dissect down to the cyst itself.  Skin flaps were created using scalpel as well, and then the cyst was excised, intact.  It was sent off to pathology.  The cavity was then irrigated and hemostasis was assured with pressure and one figure eight suture.  The wound was then closed in two layers using 3-0 Vicryl and 4-0 Monocryl.  The incision was cleaned and sealed with DermaBond.  Attention was then turned to the right neck.  The area was prepped and draped in usual sterile fashion.  Local anesthetic was infused intradermally.  An elliptical 7 mm incision was made surrounding the skin tag to excise with clear margins.  Scalpel was used to dissect down the skin level to excise the skin tag in its entirety.  The cavity was then irrigated and hemostasis was assured with pressure.  The wound was then closed with 4-0 Monocryl.  The incision was cleaned  and sealed with DermaBond.  The patient tolerated the procedure well and all sharps were appropriately disposed of at the end of the case.  --Patient may apply ice pack over the wounds for comfort. --May take Tylenol and Ibuprofen for pain --Follow up next week for wound check.   Melvyn Neth, MD

## 2019-09-17 ENCOUNTER — Encounter: Payer: BC Managed Care – PPO | Admitting: Surgery

## 2019-09-23 ENCOUNTER — Other Ambulatory Visit: Payer: Self-pay

## 2019-09-23 ENCOUNTER — Encounter: Payer: Self-pay | Admitting: Gastroenterology

## 2019-09-24 ENCOUNTER — Encounter: Payer: Self-pay | Admitting: Anesthesiology

## 2019-10-14 ENCOUNTER — Other Ambulatory Visit: Admission: RE | Admit: 2019-10-14 | Payer: BC Managed Care – PPO | Source: Ambulatory Visit

## 2019-10-14 NOTE — Discharge Instructions (Signed)
General Anesthesia, Adult, Care After This sheet gives you information about how to care for yourself after your procedure. Your health care provider may also give you more specific instructions. If you have problems or questions, contact your health care provider. What can I expect after the procedure? After the procedure, the following side effects are common:  Pain or discomfort at the IV site.  Nausea.  Vomiting.  Sore throat.  Trouble concentrating.  Feeling cold or chills.  Weak or tired.  Sleepiness and fatigue.  Soreness and body aches. These side effects can affect parts of the body that were not involved in surgery. Follow these instructions at home:  For at least 24 hours after the procedure:  Have a responsible adult stay with you. It is important to have someone help care for you until you are awake and alert.  Rest as needed.  Do not: ? Participate in activities in which you could fall or become injured. ? Drive. ? Use heavy machinery. ? Drink alcohol. ? Take sleeping pills or medicines that cause drowsiness. ? Make important decisions or sign legal documents. ? Take care of children on your own. Eating and drinking  Follow any instructions from your health care provider about eating or drinking restrictions.  When you feel hungry, start by eating small amounts of foods that are soft and easy to digest (bland), such as toast. Gradually return to your regular diet.  Drink enough fluid to keep your urine pale yellow.  If you vomit, rehydrate by drinking water, juice, or clear broth. General instructions  If you have sleep apnea, surgery and certain medicines can increase your risk for breathing problems. Follow instructions from your health care provider about wearing your sleep device: ? Anytime you are sleeping, including during daytime naps. ? While taking prescription pain medicines, sleeping medicines, or medicines that make you drowsy.  Return to  your normal activities as told by your health care provider. Ask your health care provider what activities are safe for you.  Take over-the-counter and prescription medicines only as told by your health care provider.  If you smoke, do not smoke without supervision.  Keep all follow-up visits as told by your health care provider. This is important. Contact a health care provider if:  You have nausea or vomiting that does not get better with medicine.  You cannot eat or drink without vomiting.  You have pain that does not get better with medicine.  You are unable to pass urine.  You develop a skin rash.  You have a fever.  You have redness around your IV site that gets worse. Get help right away if:  You have difficulty breathing.  You have chest pain.  You have blood in your urine or stool, or you vomit blood. Summary  After the procedure, it is common to have a sore throat or nausea. It is also common to feel tired.  Have a responsible adult stay with you for the first 24 hours after general anesthesia. It is important to have someone help care for you until you are awake and alert.  When you feel hungry, start by eating small amounts of foods that are soft and easy to digest (bland), such as toast. Gradually return to your regular diet.  Drink enough fluid to keep your urine pale yellow.  Return to your normal activities as told by your health care provider. Ask your health care provider what activities are safe for you. This information is not   intended to replace advice given to you by your health care provider. Make sure you discuss any questions you have with your health care provider. Document Revised: 05/03/2017 Document Reviewed: 12/14/2016 Elsevier Patient Education  2020 [employer]  

## 2019-10-16 ENCOUNTER — Ambulatory Visit
Admission: RE | Admit: 2019-10-16 | Payer: BC Managed Care – PPO | Source: Home / Self Care | Admitting: Gastroenterology

## 2019-10-16 HISTORY — DX: Other seasonal allergic rhinitis: J30.2

## 2019-10-16 SURGERY — COLONOSCOPY WITH PROPOFOL
Anesthesia: General

## 2020-07-15 ENCOUNTER — Encounter: Payer: BC Managed Care – PPO | Admitting: Physician Assistant

## 2020-07-21 ENCOUNTER — Encounter: Payer: BC Managed Care – PPO | Admitting: Physician Assistant

## 2020-07-29 ENCOUNTER — Other Ambulatory Visit: Payer: Self-pay | Admitting: Physician Assistant

## 2020-07-29 DIAGNOSIS — I10 Essential (primary) hypertension: Secondary | ICD-10-CM

## 2020-07-29 DIAGNOSIS — F39 Unspecified mood [affective] disorder: Secondary | ICD-10-CM

## 2021-01-21 IMAGING — CR LEFT RIBS AND CHEST - 3+ VIEW
5 series · 5 of 5 positions shown · non-contrast
Comparison: Chest radiograph April 02, 2016

CLINICAL DATA: Pain following recent fall

EXAM:
LEFT RIBS AND CHEST - 3+ VIEW

[chest pa]
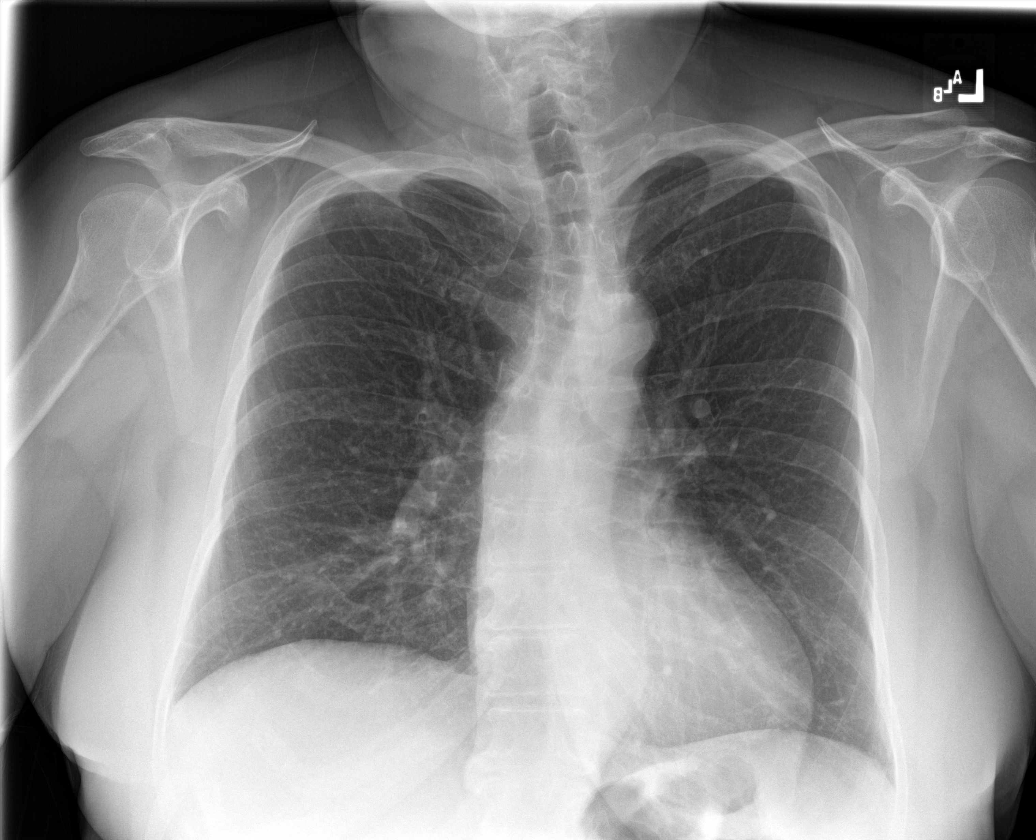

[rib pa (1 of 2)]
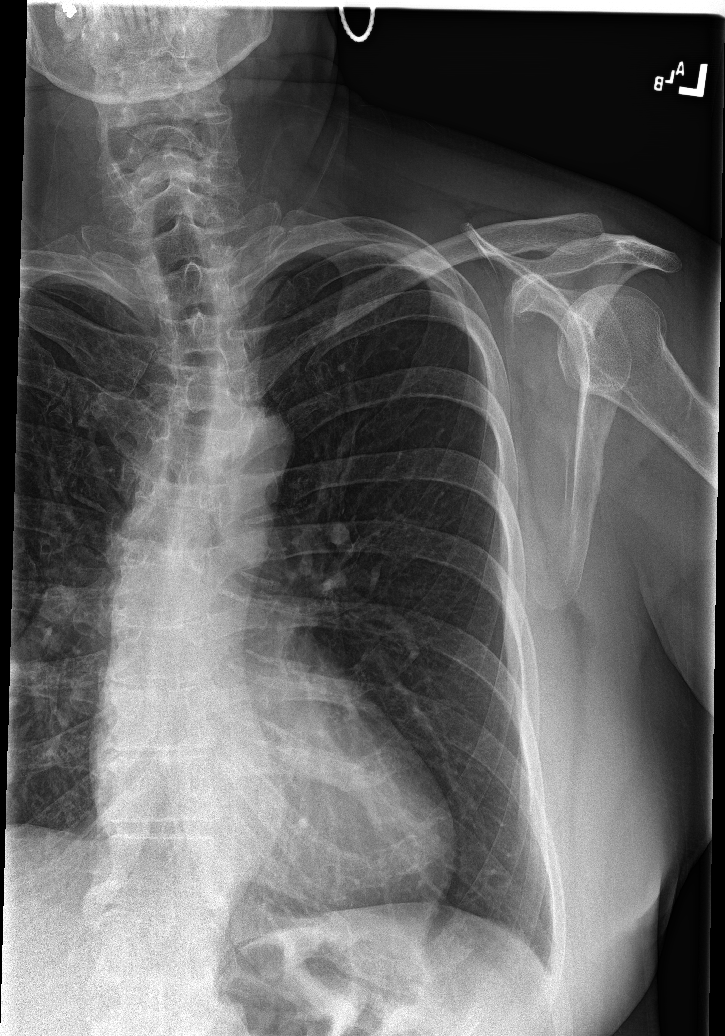

[rib pa (2 of 2)]
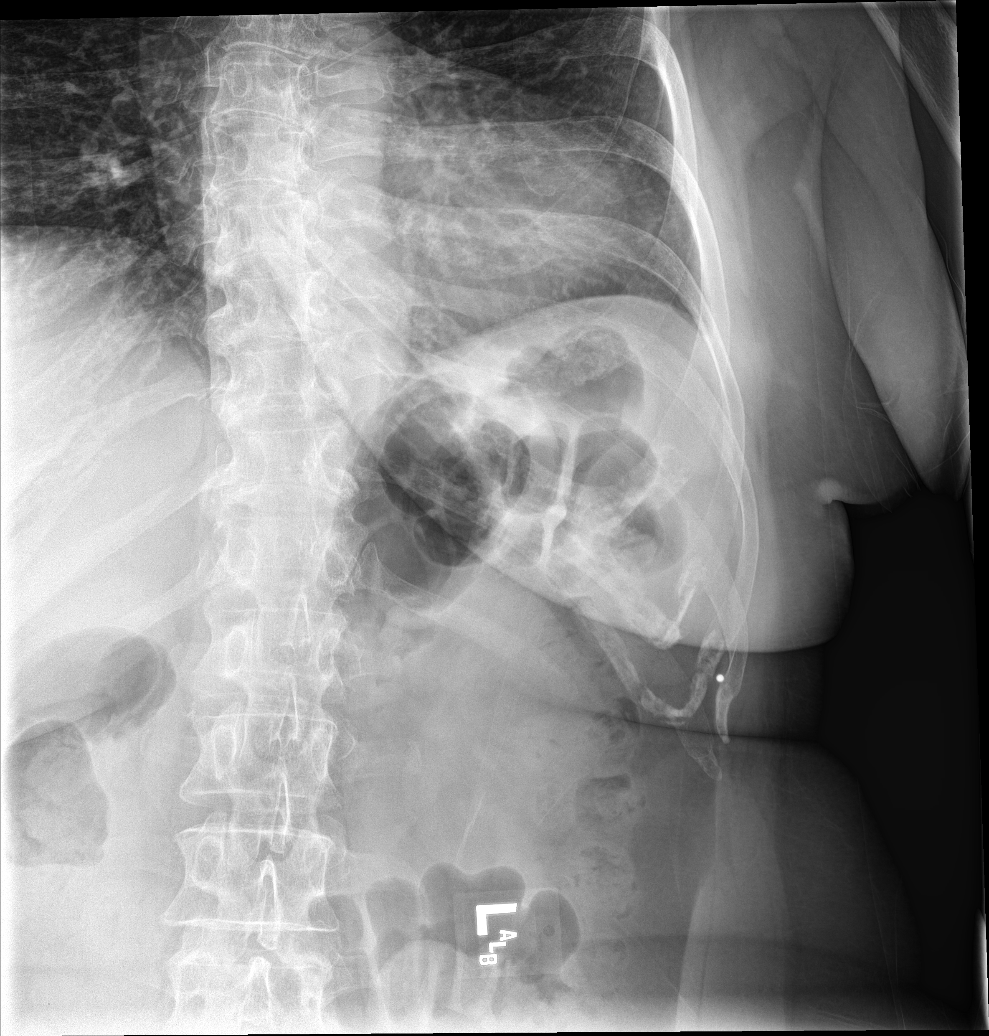

[rib obl (1 of 2)]
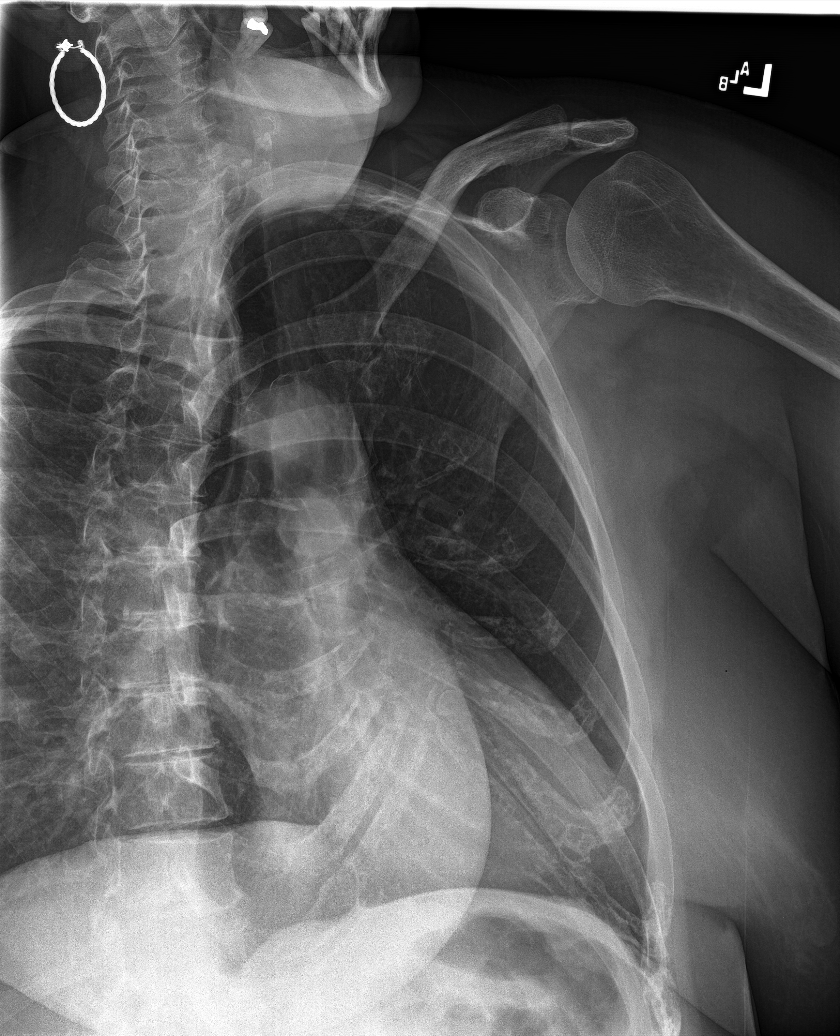

[rib obl (2 of 2)]
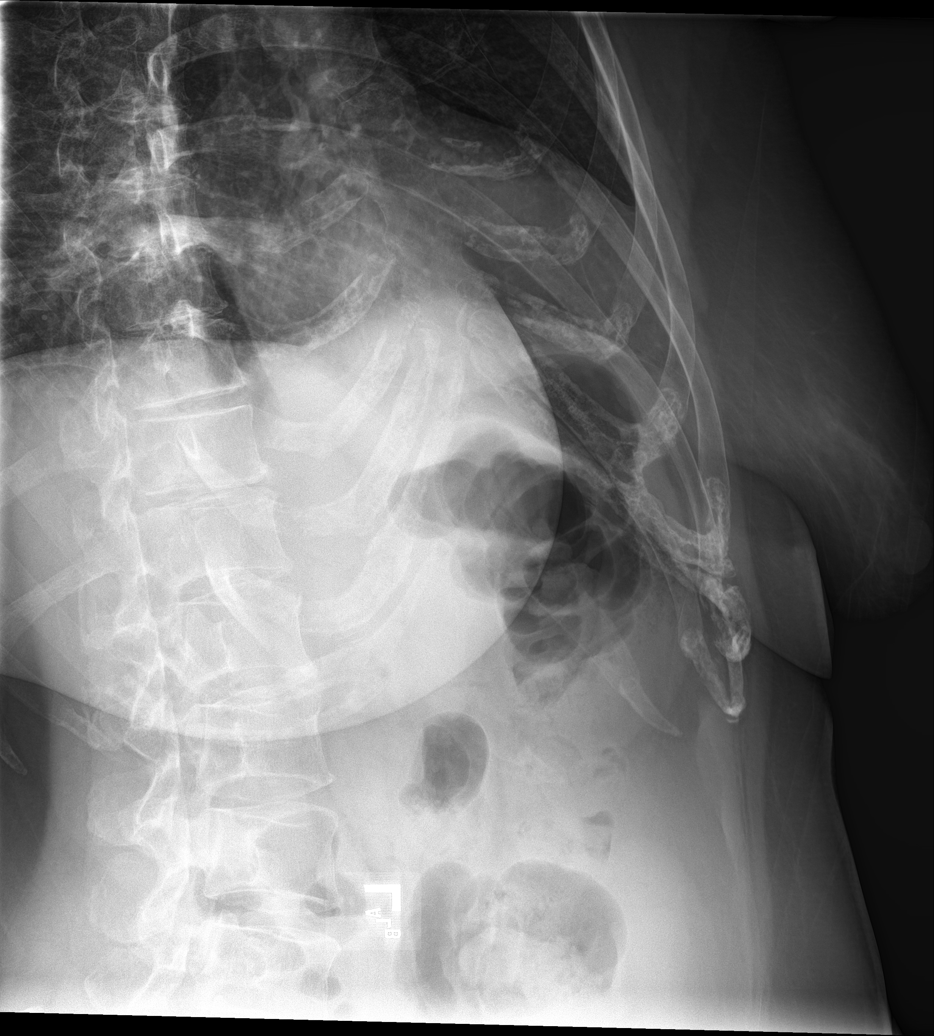

[5 of 5 positions shown; findings below may reference images not displayed]

FINDINGS: Frontal chest as well as oblique and cone-down rib images were
obtained. There is no edema or consolidation. Heart size and
pulmonary vascularity are normal. No adenopathy. There is no evident
pneumothorax or pleural effusion. No rib fracture. There is upper
thoracic levoscoliosis.
IMPRESSION: No rib fracture evident. Lungs clear. Upper thoracic levoscoliosis.
No pneumothorax.

## 2021-03-02 ENCOUNTER — Other Ambulatory Visit: Payer: Self-pay | Admitting: Physician Assistant

## 2021-03-02 DIAGNOSIS — I10 Essential (primary) hypertension: Secondary | ICD-10-CM

## 2021-06-19 ENCOUNTER — Other Ambulatory Visit: Payer: Self-pay | Admitting: Physician Assistant

## 2021-06-19 DIAGNOSIS — F39 Unspecified mood [affective] disorder: Secondary | ICD-10-CM

## 2021-07-18 ENCOUNTER — Encounter: Payer: Self-pay | Admitting: Family Medicine

## 2021-07-18 ENCOUNTER — Ambulatory Visit: Payer: Self-pay | Admitting: Family Medicine

## 2021-07-18 ENCOUNTER — Other Ambulatory Visit: Payer: Self-pay

## 2021-07-18 VITALS — BP 116/83 | HR 64 | Temp 98.5°F | Resp 14 | Wt 212.0 lb

## 2021-07-18 DIAGNOSIS — E7800 Pure hypercholesterolemia, unspecified: Secondary | ICD-10-CM

## 2021-07-18 DIAGNOSIS — F411 Generalized anxiety disorder: Secondary | ICD-10-CM

## 2021-07-18 DIAGNOSIS — I10 Essential (primary) hypertension: Secondary | ICD-10-CM

## 2021-07-18 MED ORDER — PROPRANOLOL HCL ER 80 MG PO CP24: 80.0000 mg | ORAL_CAPSULE | Freq: Every day | ORAL | 3 refills | Status: DC

## 2021-07-18 MED ORDER — SERTRALINE HCL 100 MG PO TABS: 100.0000 mg | ORAL_TABLET | Freq: Every day | ORAL | 3 refills | Status: DC

## 2021-07-18 MED ORDER — HYDROCHLOROTHIAZIDE 25 MG PO TABS: 25.0000 mg | ORAL_TABLET | Freq: Every day | ORAL | 3 refills | Status: DC

## 2021-07-18 NOTE — Assessment & Plan Note (Signed)
Chronic, stable 116/83 ?Denies CP ?Denies SOB ?Denies DOE ?No LE Edema noted on exam ?Continue medications- HCTZ and BB ?Refills provided ?Seek emergent care if you develop chest pain or chest pressure ?Encourage use of DASH and/or Mediterranean diet to assist ? ?

## 2021-07-18 NOTE — Progress Notes (Signed)
? ?  ? ?I,Elena D DeSanto,acting as a scribe for Gwyneth Sprout, FNP.,have documented all relevant documentation on the behalf of Gwyneth Sprout, FNP,as directed by  Gwyneth Sprout, FNP while in the presence of Gwyneth Sprout, FNP. ? ? ?Established patient visit ? ? ?Patient: Nicole Copeland   DOB: 1960/07/12   61 y.o. Female  MRN: [id number] ?Visit Date: 07/18/2021 ? ?Today's healthcare provider: Gwyneth Sprout, FNP  ?Patient presents for to establish care with new PCP.  Introduced to Designer, jewellery role and practice setting.  All questions answered.  Discussed provider/patient relationship and expectations. ? ? ?Chief Complaint  ?Patient presents with  ? Depression  ? ?Subjective  ?  ?HPI  ?Hypertension, follow-up ? ?BP Readings from Last 3 Encounters:  ?07/18/21 116/83  ?09/11/19 119/80  ?08/21/19 125/80  ? Wt Readings from Last 3 Encounters:  ?07/18/21 212 lb (96.2 kg)  ?09/11/19 211 lb (95.7 kg)  ?08/21/19 210 lb 6.4 oz (95.4 kg)  ?  ? ?She was last seen for hypertension 2 years ago.  ?BP at that visit was as above. Management since that visit includes none. ? ?She reports good compliance with treatment. ?She is not having side effects.  ?She is following a Regular diet. ?She is exercising. ?She does not smoke. ? ?Use of agents associated with hypertension: none.  ? ?Outside blood pressures are ranging 110-120's over 70's. ?Symptoms: ?No chest pain No chest pressure  ?No palpitations No syncope  ?No dyspnea No orthopnea  ?No paroxysmal nocturnal dyspnea No lower extremity edema  ? ?Pertinent labs: ?Lab Results  ?Component Value Date  ? CHOL 209 (H) 07/10/2019  ? HDL 51 07/10/2019  ? LDLCALC 133 (H) 07/10/2019  ? TRIG 142 07/10/2019  ? CHOLHDL 4.3 07/11/2018  ? Lab Results  ?Component Value Date  ? NA 141 07/10/2019  ? K 4.4 07/10/2019  ? CREATININE 0.81 07/10/2019  ? GFRNONAA 81 07/10/2019  ? GLUCOSE 78 07/10/2019  ? TSH 1.840 07/10/2019  ?  ? ?The 10-year ASCVD risk score (Arnett DK, et al., 2019) is: 3.6%   ? ?--------------------------------------------------------------------------------------------------- ?Lipid/Cholesterol, Follow-up ? ?Last lipid panel Other pertinent labs  ?Lab Results  ?Component Value Date  ? CHOL 209 (H) 07/10/2019  ? HDL 51 07/10/2019  ? LDLCALC 133 (H) 07/10/2019  ? TRIG 142 07/10/2019  ? CHOLHDL 4.3 07/11/2018  ? Lab Results  ?Component Value Date  ? ALT 19 07/10/2019  ? AST 23 07/10/2019  ? PLT 311 07/10/2019  ? TSH 1.840 07/10/2019  ?  ? ?She was last seen for this 2 years ago.  ?Management since that visit includes none. ? ?She reports good compliance with treatment. ?She is not having side effects.  ? ?Symptoms: ?No chest pain No chest pressure/discomfort  ?No dyspnea No lower extremity edema  ?No numbness or tingling of extremity No orthopnea  ?No palpitations No paroxysmal nocturnal dyspnea  ?No speech difficulty No syncope  ? ? ?The 10-year ASCVD risk score (Arnett DK, et al., 2019) is: 3.6% ? ?--------------------------------------------------------------------------------------------------- ?Depression screen [facility] 2/9 07/18/2021 07/11/2018 03/22/2017  ?Decreased Interest 0 0 0  ?Down, Depressed, Hopeless 0 0 0  ?PHQ - 2 Score 0 0 0  ?Altered sleeping 0 0 -  ?Tired, decreased energy 1 0 -  ?Change in appetite 1 0 -  ?Feeling bad or failure about yourself  0 0 -  ?Trouble concentrating 0 0 -  ?Moving slowly or fidgety/restless 0 0 -  ?Suicidal thoughts 0  0 -  ?PHQ-9 Score 2 0 -  ?Difficult doing work/chores Not difficult at all Not difficult at all -  ? ? ? ?Medications: ?Outpatient Medications Prior to Visit  ?Medication Sig  ? aspirin 81 MG tablet Take by mouth.  ? Cholecalciferol (VITAMIN D-3) 25 MCG (1000 UT) CAPS Take 1 capsule by mouth daily.  ? ibuprofen (ADVIL) 200 MG tablet Take by mouth every 6 (six) hours as needed.   ? milk thistle 175 MG tablet Take 175 mg by mouth daily.  ? Multiple Vitamins-Minerals (VITAMIN D3 COMPLETE PO) Take by mouth. When needed  ? Multiple  Vitamins-Minerals (WOMENS 50+ MULTI VITAMIN/MIN) TABS Take by mouth.  ? [DISCONTINUED] hydrochlorothiazide (HYDRODIURIL) 25 MG tablet TAKE 1 TABLET BY MOUTH EVERY DAY  ? [DISCONTINUED] propranolol ER (INDERAL LA) 80 MG 24 hr capsule TAKE 1 CAPSULE BY MOUTH EVERY DAY  ? [DISCONTINUED] sertraline (ZOLOFT) 100 MG tablet TAKE 1 TABLET BY MOUTH EVERY DAY  ? ?No facility-administered medications prior to visit.  ? ? ?Review of Systems ? ? ?  Objective  ?  ?BP 116/83 (BP Location: Left Arm, Patient Position: Sitting, Cuff Size: Large)   Pulse 64   Temp 98.5 ?F (36.9 ?C) (Oral)   Resp 14   Wt 212 lb (96.2 kg)   SpO2 97% Comment: room air  BMI 35.28 kg/m?  ? ? ?Physical Exam  ? ? ?No results found for any visits on 07/18/21. ? Assessment & Plan  ?  ? ?Problem List Items Addressed This Visit   ? ?  ? Cardiovascular and Mediastinum  ? Essential hypertension  ?  Chronic, stable 116/83 ?Denies CP ?Denies SOB ?Denies DOE ?No LE Edema noted on exam ?Continue medications- HCTZ and BB ?Refills provided ?Seek emergent care if you develop chest pain or chest pressure ?Encourage use of DASH and/or Mediterranean diet to assist ? ?  ?  ? Relevant Medications  ? propranolol ER (INDERAL LA) 80 MG 24 hr capsule  ? hydrochlorothiazide (HYDRODIURIL) 25 MG tablet  ?  ? Other  ? GAD (generalized anxiety disorder) - Primary  ?  Chronic, stable ?Previously started on medication due to loss of 2 family members in a short period of time ?Has been without medication for 1 week ?Due to loss of medication reports increase in crying episodes ?Denies concern for SI or HI ?Contracted to safety ?PHQ reviewed ?Continue medication ?Recommend follow up at 2 months if needed due to lapse of medication ?  ?  ? Relevant Medications  ? sertraline (ZOLOFT) 100 MG tablet  ? Pure hypercholesterolemia  ?  Chronic, previously stable without use of stable ?We recommend diet low in saturated fat and regular exercise - 30 min at least 5 times per week ?Did not  wish to get labs today d/t lack of insurance/cost of labs ? ?  ?  ? Relevant Medications  ? propranolol ER (INDERAL LA) 80 MG 24 hr capsule  ? hydrochlorothiazide (HYDRODIURIL) 25 MG tablet  ? ? ? ?Return for annual examination.  ?   ? ?I, Gwyneth Sprout, FNP, have reviewed all documentation for this visit. The documentation on 07/18/21 for the exam, diagnosis, procedures, and orders are all accurate and complete. ? ? ? ?Gwyneth Sprout, FNP  ?[place] ?[phone] (phone) ?[phone] (fax) ? ?[facility] ?

## 2021-07-18 NOTE — Assessment & Plan Note (Signed)
Chronic, stable ?Previously started on medication due to loss of 2 family members in a short period of time ?Has been without medication for 1 week ?Due to loss of medication reports increase in crying episodes ?Denies concern for SI or HI ?Contracted to safety ?PHQ reviewed ?Continue medication ?Recommend follow up at 2 months if needed due to lapse of medication ?

## 2021-07-18 NOTE — Assessment & Plan Note (Signed)
Chronic, previously stable without use of stable ?We recommend diet low in saturated fat and regular exercise - 30 min at least 5 times per week ?Did not wish to get labs today d/t lack of insurance/cost of labs ? ?

## 2021-09-14 ENCOUNTER — Other Ambulatory Visit: Payer: Self-pay | Admitting: Family Medicine

## 2021-09-14 DIAGNOSIS — F411 Generalized anxiety disorder: Secondary | ICD-10-CM

## 2021-09-14 NOTE — Telephone Encounter (Signed)
Requested medication (s) are due for refill today - no ? ?Requested medication (s) are on the active medication list -yes ? ?Future visit scheduled - no ? ?Last refill: 07/18/21 #90 3RF ? ?Notes to clinic: Request RF: too soon, fails lab protocol- over 1 year-2021 ? ?Requested Prescriptions  ?Pending Prescriptions Disp Refills  ? sertraline (ZOLOFT) 100 MG tablet [Pharmacy Med Name: SERTRALINE HCL 100 MG TABLET] 90 tablet 4  ?  Sig: TAKE 1 TABLET BY MOUTH EVERY DAY  ?  ? Psychiatry:  Antidepressants - SSRI - sertraline Failed - 09/14/2021  1:30 PM  ?  ?  Failed - AST in normal range and within 360 days  ?  AST  ?Date Value Ref Range Status  ?07/10/2019 23 0 - 40 IU/L Final  ?  ?  ?  ?  Failed - ALT in normal range and within 360 days  ?  ALT  ?Date Value Ref Range Status  ?07/10/2019 19 0 - 32 IU/L Final  ?  ?  ?  ?  Passed - Completed PHQ-2 or PHQ-9 in the last 360 days  ?  ?  Passed - Valid encounter within last 6 months  ?  Recent Outpatient Visits   ? ?      ? 1 month ago GAD (generalized anxiety disorder)  ? [facility] Tally Joe T, FNP  ? 2 years ago Annual physical exam  ? [place], [place]  ? 3 years ago Bronchitis  ? [facility] [place], Wendee Beavers, PA-C  ? 3 years ago Annual physical exam  ? [place], [place]  ? 4 years ago Skin tag  ? [facility] [place], Anderson Malta M, [place]  ? ?  ?  ? ? ?  ?  ?  ? ? ? ?Requested Prescriptions  ?Pending Prescriptions Disp Refills  ? sertraline (ZOLOFT) 100 MG tablet [Pharmacy Med Name: SERTRALINE HCL 100 MG TABLET] 90 tablet 4  ?  Sig: TAKE 1 TABLET BY MOUTH EVERY DAY  ?  ? Psychiatry:  Antidepressants - SSRI - sertraline Failed - 09/14/2021  1:30 PM  ?  ?  Failed - AST in normal range and within 360 days  ?  AST  ?Date Value Ref Range Status  ?07/10/2019 23 0 - 40 IU/L Final  ?  ?  ?  ?  Failed - ALT in normal range and within 360 days  ?  ALT  ?Date Value Ref  Range Status  ?07/10/2019 19 0 - 32 IU/L Final  ?  ?  ?  ?  Passed - Completed PHQ-2 or PHQ-9 in the last 360 days  ?  ?  Passed - Valid encounter within last 6 months  ?  Recent Outpatient Visits   ? ?      ? 1 month ago GAD (generalized anxiety disorder)  ? [facility] Tally Joe T, FNP  ? 2 years ago Annual physical exam  ? [place], [place]  ? 3 years ago Bronchitis  ? [facility] [place], Wendee Beavers, PA-C  ? 3 years ago Annual physical exam  ? [place], [place]  ? 4 years ago Skin tag  ? [facility] [place], Anderson Malta M, [place]  ? ?  ?  ? ? ?  ?  ?  ? ? ? ?

## 2021-10-28 ENCOUNTER — Other Ambulatory Visit: Payer: Self-pay | Admitting: Family Medicine

## 2021-10-28 DIAGNOSIS — F411 Generalized anxiety disorder: Secondary | ICD-10-CM

## 2021-12-01 ENCOUNTER — Telehealth: Payer: Self-pay | Admitting: Family Medicine

## 2021-12-01 NOTE — Telephone Encounter (Signed)
Patient called back- she reports when she went to pick up the original Rx she could not afford a 90 day supply- so it was changed to 30 day. Patient reports the bottle was not changed to reflect- neither were the other bottles updated to reflect 30 days when she picked them up.  Call to the pharmacy-they state patient had 30 days supply in March and then got 90 day supply in May- make sure patient is not looking at wrong bottle- did she lose them or misplace them? Patient can get RF- she has RF on file. $11 for 90 days supply.   Call to patient- advised if she can not find anymore HCTZ at her home- she can RF - she does have RF per pharmacist. Patient will call and let them know that she needs more. Note is in chart in case patient should run short- for PCP office.

## 2021-12-01 NOTE — Telephone Encounter (Signed)
Pt stated she contacted her pharmacy for a refill of the hydrochlorothiazide (HYDRODIURIL) 25 MG tablet  but they refused stating it was too soon to refill it. Pt stated she was only got a 30 day supply due to the cost.  Medication Refill - Medication: hydrochlorothiazide (HYDRODIURIL) 25 MG tablet   Has the patient contacted their pharmacy? Yes.   Pt told to contact provider  Preferred Pharmacy (with phone number or street name):  [id number] [place]  [phone] Fax:  [phone]    Has the patient been seen for an appointment in the last year OR does the patient have an upcoming appointment? Yes.    Agent: Please be advised that RX refills may take up to 3 business days. We ask that you follow-up with your pharmacy.

## 2021-12-01 NOTE — Telephone Encounter (Signed)
[facility] and spoke to [place], [facility] about the refill(s) Hydrochlorothiazide requested. Advised it was sent on 07/18/21 #90/3 refill(s). She says they have it and the patient already picked up a 3 month supply on 10/05/21 and is due for refill on 01/01/22. I advised the patient mentioned picking up a 30 day supply, she says that was Propranolol and Sertraline on 11/15/21, due for refill on 12/12/21. Patient called, left VM to return the call to the office. Will need clarification on what she's needing and to let her know of the above from the pharmacist.

## 2022-05-29 ENCOUNTER — Other Ambulatory Visit: Payer: Self-pay | Admitting: Family Medicine

## 2022-05-29 DIAGNOSIS — F411 Generalized anxiety disorder: Secondary | ICD-10-CM

## 2022-06-14 ENCOUNTER — Other Ambulatory Visit: Payer: Self-pay | Admitting: Family Medicine

## 2022-06-14 DIAGNOSIS — F411 Generalized anxiety disorder: Secondary | ICD-10-CM

## 2022-06-14 NOTE — Telephone Encounter (Signed)
Requested Prescriptions  Pending Prescriptions Disp Refills   sertraline (ZOLOFT) 100 MG tablet [Pharmacy Med Name: SERTRALINE HCL 100 MG TABLET] 90 tablet 0    Sig: TAKE 1 TABLET BY MOUTH EVERY DAY     Psychiatry:  Antidepressants - SSRI - sertraline Failed - 06/14/2022 10:48 AM      Failed - AST in normal range and within 360 days    AST  Date Value Ref Range Status  07/10/2019 23 0 - 40 IU/L Final         Failed - ALT in normal range and within 360 days    ALT  Date Value Ref Range Status  07/10/2019 19 0 - 32 IU/L Final         Failed - Valid encounter within last 6 months    Recent Outpatient Visits           11 months ago GAD (generalized anxiety disorder)   [place] Gwyneth Sprout, [place]   2 years ago Annual physical exam   [facility] [place], Clearnce Sorrel, [place]   3 years ago [place], Wendee Beavers, [place]   3 years ago Annual physical exam   [place], Anderson Malta M, [place]   5 years ago Skin tag   [place], Anderson Malta M, [place]              Passed - Completed PHQ-2 or PHQ-9 in the last 360 days

## 2022-08-06 ENCOUNTER — Other Ambulatory Visit: Payer: Self-pay | Admitting: Family Medicine

## 2022-08-06 DIAGNOSIS — I10 Essential (primary) hypertension: Secondary | ICD-10-CM

## 2022-09-15 ENCOUNTER — Other Ambulatory Visit: Payer: Self-pay | Admitting: Family Medicine

## 2022-09-15 DIAGNOSIS — F411 Generalized anxiety disorder: Secondary | ICD-10-CM

## 2022-09-17 NOTE — Telephone Encounter (Signed)
Requested medication (s) are due for refill today: yes  Requested medication (s) are on the active medication list: yes  Last refill:  06/14/22  Future visit scheduled: no  Notes to clinic:  Unable to refill per protocol, courtesy refill already given, routing for provider approval.      Requested Prescriptions  Pending Prescriptions Disp Refills   sertraline (ZOLOFT) 100 MG tablet [Pharmacy Med Name: SERTRALINE HCL 100 MG TABLET] 90 tablet 0    Sig: TAKE 1 TABLET BY MOUTH EVERY DAY     Psychiatry:  Antidepressants - SSRI - sertraline Failed - 09/15/2022  1:14 AM      Failed - AST in normal range and within 360 days    AST  Date Value Ref Range Status  07/10/2019 23 0 - 40 IU/L Final         Failed - ALT in normal range and within 360 days    ALT  Date Value Ref Range Status  07/10/2019 19 0 - 32 IU/L Final         Failed - Completed PHQ-2 or PHQ-9 in the last 360 days      Failed - Valid encounter within last 6 months    Recent Outpatient Visits           1 year ago GAD (generalized anxiety disorder)   [facility] Jacky Kindle, FNP   3 years ago Annual physical exam   [facility] [place], Alessandra [place], [place]   4 years ago Bronchitis   [facility] [place], [place], [place]   4 years ago Annual physical exam   [facility] [place], Victorino Dike M, [place]   5 years ago Skin tag   [facility] [place], [place], [place]

## 2022-10-23 ENCOUNTER — Other Ambulatory Visit: Payer: Self-pay | Admitting: Family Medicine

## 2022-10-23 DIAGNOSIS — I10 Essential (primary) hypertension: Secondary | ICD-10-CM

## 2022-11-16 ENCOUNTER — Other Ambulatory Visit: Payer: Self-pay | Admitting: Family Medicine

## 2022-11-16 DIAGNOSIS — I10 Essential (primary) hypertension: Secondary | ICD-10-CM

## 2022-11-20 ENCOUNTER — Other Ambulatory Visit: Payer: Self-pay | Admitting: Family Medicine

## 2022-11-20 DIAGNOSIS — I10 Essential (primary) hypertension: Secondary | ICD-10-CM

## 2022-11-20 NOTE — Telephone Encounter (Signed)
Requested medication (s) are due for refill today: yes  Requested medication (s) are on the active medication list: yes  Last refill:  10/24/22 #30  Future visit scheduled: yes  Notes to clinic:  called pt and appt scheduled. Lab work out of date    Requested Prescriptions  Pending Prescriptions Disp Refills   hydrochlorothiazide (HYDRODIURIL) 25 MG tablet [Pharmacy Med Name: HYDROCHLOROTHIAZIDE 25 MG TAB] 30 tablet 0    Sig: Take 1 tablet (25 mg total) by mouth daily. Please schedule office visit before any future refill.     Cardiovascular: Diuretics - Thiazide Failed - 11/20/2022  2:43 AM      Failed - Cr in normal range and within 180 days    Creat  Date Value Ref Range Status  03/22/2017 0.82 0.50 - 1.05 mg/dL Final    Comment:    For patients >76 years of age, the reference limit for Creatinine is approximately 13% higher for people identified as African-American. .    Creatinine, Ser  Date Value Ref Range Status  07/10/2019 0.81 0.57 - 1.00 mg/dL Final         Failed - K in normal range and within 180 days    Potassium  Date Value Ref Range Status  07/10/2019 4.4 3.5 - 5.2 mmol/L Final         Failed - Na in normal range and within 180 days    Sodium  Date Value Ref Range Status  07/10/2019 141 134 - 144 mmol/L Final         Failed - Valid encounter within last 6 months    Recent Outpatient Visits           1 year ago GAD (generalized anxiety disorder)   [place] [facility] Jacky Kindle, FNP   3 years ago Annual physical exam   [facility] [place], Alessandra [place]vel[place], [place]   4 years ago Bronchitis   [facility] [place], [place], [place]   4 years ago Annual physical exam   [facility] [place], Alessandra [place], [place]   5 years ago Skin tag   [facility] [place], Alessandra [place], [place]       Future Appointments             In 3 weeks Jacky Kindle, FNP [facility]            Passed - Last BP in normal range    BP Readings from Last 1 Encounters:  07/18/21 116/83

## 2022-12-04 ENCOUNTER — Other Ambulatory Visit: Payer: Self-pay | Admitting: Family Medicine

## 2022-12-04 DIAGNOSIS — I10 Essential (primary) hypertension: Secondary | ICD-10-CM

## 2022-12-06 NOTE — Telephone Encounter (Signed)
Requested Prescriptions  Refused Prescriptions Disp Refills   hydrochlorothiazide (HYDRODIURIL) 25 MG tablet [Pharmacy Med Name: HYDROCHLOROTHIAZIDE 25 MG TAB] 90 tablet 1    Sig: TAKE 1 TABLET (25 MG TOTAL) BY MOUTH DAILY. PLEASE SCHEDULE OFFICE VISIT BEFORE ANY FUTURE REFILL.     Cardiovascular: Diuretics - Thiazide Failed - 12/04/2022  8:23 PM      Failed - Cr in normal range and within 180 days    Creat  Date Value Ref Range Status  03/22/2017 0.82 0.50 - 1.05 mg/dL Final    Comment:    For patients >69 years of age, the reference limit for Creatinine is approximately 13% higher for people identified as African-American. .    Creatinine, Ser  Date Value Ref Range Status  07/10/2019 0.81 0.57 - 1.00 mg/dL Final         Failed - K in normal range and within 180 days    Potassium  Date Value Ref Range Status  07/10/2019 4.4 3.5 - 5.2 mmol/L Final         Failed - Na in normal range and within 180 days    Sodium  Date Value Ref Range Status  07/10/2019 141 134 - 144 mmol/L Final         Failed - Valid encounter within last 6 months    Recent Outpatient Visits           1 year ago GAD (generalized anxiety disorder)   [facility] Jacky Kindle, FNP   3 years ago Annual physical exam   [facility] [place], Alessandra [place], [place]   4 years ago Bronchitis   [facility] [place], [place], [place]   4 years ago Annual physical exam   [facility] [place], Victorino Dike M, [place]   5 years ago Skin tag   [facility] [place], Alessandra [place], [place]       Future Appointments             In 1 week Jacky Kindle, FNP [place] [facility]            Passed - Last BP in normal range    BP Readings from Last 1 Encounters:  07/18/21 116/83

## 2022-12-13 ENCOUNTER — Ambulatory Visit: Payer: Self-pay | Admitting: Family Medicine

## 2022-12-20 ENCOUNTER — Ambulatory Visit (INDEPENDENT_AMBULATORY_CARE_PROVIDER_SITE_OTHER): Payer: Self-pay | Admitting: Family Medicine

## 2022-12-20 DIAGNOSIS — Z91199 Patient's noncompliance with other medical treatment and regimen due to unspecified reason: Secondary | ICD-10-CM

## 2022-12-20 NOTE — Progress Notes (Signed)
Patient was not seen for appt d/t no call, no show, or late arrival >10 mins past appt time.   Liahm Grivas T Jance Siek, FNP  [facility] [address] [place], [place] [address] [phone] (phone) [phone] (fax) [facility]  

## 2023-01-13 ENCOUNTER — Other Ambulatory Visit: Payer: Self-pay | Admitting: Family Medicine

## 2023-01-13 DIAGNOSIS — I10 Essential (primary) hypertension: Secondary | ICD-10-CM

## 2023-01-15 NOTE — Telephone Encounter (Signed)
Requested medication (s) are due for refill today - yes  Requested medication (s) are on the active medication list -yes  Future visit scheduled -no  Last refill: 11/20/22 #30  Notes to clinic: last refill has notes- no show appointment- sent for review   Requested Prescriptions  Pending Prescriptions Disp Refills   hydrochlorothiazide (HYDRODIURIL) 25 MG tablet [Pharmacy Med Name: HYDROCHLOROTHIAZIDE 25 MG TAB] 30 tablet 0    Sig: TAKE 1 TABLET (25 MG TOTAL) BY MOUTH DAILY. PLEASE SCHEDULE OFFICE VISIT BEFORE ANY FUTURE REFILL.     Cardiovascular: Diuretics - Thiazide Failed - 01/13/2023  1:20 AM      Failed - Cr in normal range and within 180 days    Creat  Date Value Ref Range Status  03/22/2017 0.82 0.50 - 1.05 mg/dL Final    Comment:    For patients >42 years of age, the reference limit for Creatinine is approximately 13% higher for people identified as African-American. .    Creatinine, Ser  Date Value Ref Range Status  07/10/2019 0.81 0.57 - 1.00 mg/dL Final         Failed - K in normal range and within 180 days    Potassium  Date Value Ref Range Status  07/10/2019 4.4 3.5 - 5.2 mmol/L Final         Failed - Na in normal range and within 180 days    Sodium  Date Value Ref Range Status  07/10/2019 141 134 - 144 mmol/L Final         Failed - Valid encounter within last 6 months    Recent Outpatient Visits           3 weeks ago No-show for appointment   [facility] Merita Norton T, FNP   1 year ago GAD (generalized anxiety disorder)   [facility] Jacky Kindle, FNP   3 years ago Annual physical exam   [facility] [place], Alessandra [place], [place]   4 years ago Bronchitis   [facility] [place], Ricki Rodriguez M, [place]   4 years ago Annual physical exam   [facility] Joycelyn Man M, [place]              Passed - Last BP in normal  range    BP Readings from Last 1 Encounters:  07/18/21 116/83            Requested Prescriptions  Pending Prescriptions Disp Refills   hydrochlorothiazide (HYDRODIURIL) 25 MG tablet [Pharmacy Med Name: HYDROCHLOROTHIAZIDE 25 MG TAB] 30 tablet 0    Sig: TAKE 1 TABLET (25 MG TOTAL) BY MOUTH DAILY. PLEASE SCHEDULE OFFICE VISIT BEFORE ANY FUTURE REFILL.     Cardiovascular: Diuretics - Thiazide Failed - 01/13/2023  1:20 AM      Failed - Cr in normal range and within 180 days    Creat  Date Value Ref Range Status  03/22/2017 0.82 0.50 - 1.05 mg/dL Final    Comment:    For patients >65 years of age, the reference limit for Creatinine is approximately 13% higher for people identified as African-American. .    Creatinine, Ser  Date Value Ref Range Status  07/10/2019 0.81 0.57 - 1.00 mg/dL Final         Failed - K in normal range and within 180 days    Potassium  Date Value Ref Range Status  07/10/2019 4.4 3.5 - 5.2 mmol/L  Final         Failed - Na in normal range and within 180 days    Sodium  Date Value Ref Range Status  07/10/2019 141 134 - 144 mmol/L Final         Failed - Valid encounter within last 6 months    Recent Outpatient Visits           3 weeks ago No-show for appointment   [facility] Merita Norton T, FNP   1 year ago GAD (generalized anxiety disorder)   [facility] Jacky Kindle, FNP   3 years ago Annual physical exam   [facility] [place], Alessandra [place], [place]   4 years ago Bronchitis   [facility] [place], Lavella [place], [place]   4 years ago Annual physical exam   [facility] Joycelyn Man M, [place]              Passed - Last BP in normal range    BP Readings from Last 1 Encounters:  07/18/21 116/83

## 2023-01-20 ENCOUNTER — Other Ambulatory Visit: Payer: Self-pay | Admitting: Family Medicine

## 2023-01-20 DIAGNOSIS — F411 Generalized anxiety disorder: Secondary | ICD-10-CM

## 2023-01-21 NOTE — Telephone Encounter (Signed)
Need office visit

## 2023-02-21 ENCOUNTER — Ambulatory Visit (INDEPENDENT_AMBULATORY_CARE_PROVIDER_SITE_OTHER): Payer: Self-pay | Admitting: Family Medicine

## 2023-02-21 VITALS — BP 134/87 | HR 69 | Temp 98.3°F | Resp 16 | Ht 65.0 in | Wt 206.0 lb

## 2023-02-21 DIAGNOSIS — F411 Generalized anxiety disorder: Secondary | ICD-10-CM

## 2023-02-21 DIAGNOSIS — F39 Unspecified mood [affective] disorder: Secondary | ICD-10-CM | POA: Insufficient documentation

## 2023-02-21 DIAGNOSIS — I10 Essential (primary) hypertension: Secondary | ICD-10-CM

## 2023-02-21 MED ORDER — SERTRALINE HCL 100 MG PO TABS: 150.0000 mg | ORAL_TABLET | Freq: Every day | ORAL | 3 refills | Status: AC

## 2023-02-21 MED ORDER — HYDROCHLOROTHIAZIDE 25 MG PO TABS: 25.0000 mg | ORAL_TABLET | Freq: Every day | ORAL | 3 refills | Status: AC

## 2023-02-21 MED ORDER — SERTRALINE HCL 100 MG PO TABS: 150.0000 mg | ORAL_TABLET | Freq: Every day | ORAL | 3 refills | Status: DC

## 2023-02-21 NOTE — Assessment & Plan Note (Signed)
Chronic depressive symptoms and anxiety Denies concern for SI or SH Will increase to Zoloft 150 mg daily Discussed potential side effects, incl GI upset, sexual dysfunction, increased anxiety, and SI Discussed that it can take 6-8 weeks to reach full efficacy Contracted for safety - no SI/HI Discussed synergistic effects of medications and therapy

## 2023-02-21 NOTE — Progress Notes (Signed)
Established patient visit   Patient: Nicole Copeland   DOB: 1961-04-09   62 y.o. Female  MRN: [id number] Visit Date: 02/21/2023  Today's healthcare provider: Jacky Kindle, FNP  Introduced to nurse practitioner role and practice setting.  All questions answered.  Discussed provider/patient relationship and expectations.  Subjective    Hypertension  Depression        HPI     Hypertension    Additional comments: Patient has not been seen in over 1 year.  She states she checks her blood pressure at home and has been getting good readings but does not have a record with her.        Last edited by Adline Peals, CMA on 02/21/2023  8:28 AM.         02/21/2023    8:30 AM 07/18/2021    1:56 PM 07/11/2018    9:26 AM  PHQ9 SCORE ONLY  PHQ-9 Total Score 3 2 0      02/21/2023    8:30 AM  GAD 7 : Generalized Anxiety Score  Nervous, Anxious, on Edge 1  Control/stop worrying 1  Worry too much - different things 1  Trouble relaxing 1  Restless 1  Easily annoyed or irritable 1  Afraid - awful might happen 0  Total GAD 7 Score 6  Anxiety Difficulty Somewhat difficult     Medications: Outpatient Medications Prior to Visit  Medication Sig   [DISCONTINUED] aspirin 81 MG tablet Take by mouth.   [DISCONTINUED] hydrochlorothiazide (HYDRODIURIL) 25 MG tablet TAKE 1 TABLET (25 MG TOTAL) BY MOUTH DAILY. PLEASE SCHEDULE OFFICE VISIT BEFORE ANY FUTURE REFILL.   [DISCONTINUED] ibuprofen (ADVIL) 200 MG tablet Take by mouth every 6 (six) hours as needed.    [DISCONTINUED] milk thistle 175 MG tablet Take 175 mg by mouth daily.   [DISCONTINUED] Multiple Vitamins-Minerals (VITAMIN D3 COMPLETE PO) Take by mouth. When needed   [DISCONTINUED] Multiple Vitamins-Minerals (WOMENS 50+ MULTI VITAMIN/MIN) TABS Take by mouth.   [DISCONTINUED] sertraline (ZOLOFT) 100 MG tablet TAKE 1 TABLET BY MOUTH EVERY DAY   [DISCONTINUED] Cholecalciferol (VITAMIN D-3) 25 MCG (1000 UT) CAPS Take 1 capsule by  mouth daily.   [DISCONTINUED] propranolol ER (INDERAL LA) 80 MG 24 hr capsule TAKE 1 CAPSULE BY MOUTH EVERY DAY   No facility-administered medications prior to visit.    Review of Systems  Psychiatric/Behavioral:  Positive for depression.      Objective    BP 134/87 (BP Location: Right Arm, Patient Position: Sitting, Cuff Size: Large)   Pulse 69   Temp 98.3 F (36.8 C) (Oral)   Resp 16   Ht 5\' 5"  (1.651 m)   Wt 206 lb (93.4 kg)   BMI 34.28 kg/m   Physical Exam Vitals and nursing note reviewed.  Constitutional:      General: She is not in acute distress.    Appearance: Normal appearance. She is obese. She is not ill-appearing, toxic-appearing or diaphoretic.  HENT:     Head: Normocephalic and atraumatic.  Cardiovascular:     Rate and Rhythm: Normal rate and regular rhythm.     Pulses: Normal pulses.     Heart sounds: Normal heart sounds. No murmur heard.    No friction rub. No gallop.  Pulmonary:     Effort: Pulmonary effort is normal. No respiratory distress.     Breath sounds: Normal breath sounds. No stridor. No wheezing, rhonchi or rales.  Chest:     Chest wall: No tenderness.  Musculoskeletal:        General: No swelling, tenderness, deformity or signs of injury. Normal range of motion.     Right lower leg: No edema.     Left lower leg: No edema.  Skin:    General: Skin is warm and dry.     Capillary Refill: Capillary refill takes less than 2 seconds.     Coloration: Skin is not jaundiced or pale.     Findings: No bruising, erythema, lesion or rash.  Neurological:     General: No focal deficit present.     Mental Status: She is alert and oriented to person, place, and time. Mental status is at baseline.     Cranial Nerves: No cranial nerve deficit.     Sensory: No sensory deficit.     Motor: No weakness.     Coordination: Coordination normal.  Psychiatric:        Mood and Affect: Mood normal.        Behavior: Behavior normal.        Thought Content:  Thought content normal.        Judgment: Judgment normal.     No results found for any visits on 02/21/23.  Assessment & Plan     Problem List Items Addressed This Visit       Cardiovascular and Mediastinum   Essential hypertension    Chronic, stable Continues on low dose hydrochlorothiazide at 25 mg to assist Denies concerns for electrolyte abnormalities BP remains stable Continue medication Return as able for blood work      Relevant Medications   hydrochlorothiazide (HYDRODIURIL) 25 MG tablet     Other   GAD (generalized anxiety disorder)    Chronic, worsening Notes situational stress with family  Request to increase from 100 to 150 mg zoloft I've explained to her that drugs of the SSRI class can have side effects such as weight gain, sexual dysfunction, insomnia, headache, nausea. These medications are generally effective at alleviating symptoms of anxiety and/or depression. Let me know if significant side effects do occur.       Relevant Medications   sertraline (ZOLOFT) 100 MG tablet   Mood disorder (HCC) - Primary    Chronic depressive symptoms and anxiety Denies concern for SI or SH Will increase to Zoloft 150 mg daily Discussed potential side effects, incl GI upset, sexual dysfunction, increased anxiety, and SI Discussed that it can take 6-8 weeks to reach full efficacy Contracted for safety - no SI/HI Discussed synergistic effects of medications and therapy       Relevant Medications   sertraline (ZOLOFT) 100 MG tablet   Return if symptoms worsen or fail to improve, for anxiety and depression.      Leilani Merl, FNP, have reviewed all documentation for this visit. The documentation on 02/21/23 for the exam, diagnosis, procedures, and orders are all accurate and complete.  Jacky Kindle, FNP  [facility] [phone] (phone) [phone] (fax)  [facility]

## 2023-02-21 NOTE — Assessment & Plan Note (Signed)
Chronic, worsening Notes situational stress with family  Request to increase from 100 to 150 mg zoloft I've explained to her that drugs of the SSRI class can have side effects such as weight gain, sexual dysfunction, insomnia, headache, nausea. These medications are generally effective at alleviating symptoms of anxiety and/or depression. Let me know if significant side effects do occur.

## 2023-02-21 NOTE — Assessment & Plan Note (Signed)
Chronic, stable Continues on low dose hydrochlorothiazide at 25 mg to assist Denies concerns for electrolyte abnormalities BP remains stable Continue medication Return as able for blood work

## 2024-03-02 ENCOUNTER — Telehealth: Payer: Self-pay | Admitting: Family Medicine

## 2024-03-02 DIAGNOSIS — F411 Generalized anxiety disorder: Secondary | ICD-10-CM

## 2024-03-02 DIAGNOSIS — F39 Unspecified mood [affective] disorder: Secondary | ICD-10-CM

## 2024-03-02 NOTE — Telephone Encounter (Signed)
[facility] faxed refill request for the following medications: ° °sertraline (ZOLOFT) 100 MG tablet  ° °Please advise. ° °
